# Patient Record
Sex: Female | Born: 1975 | Race: Black or African American | Hispanic: No | Marital: Single | Smoking: Former smoker
Health system: Southern US, Community
[De-identification: ages and names within clinical notes are randomized; demographics above are authoritative.]

## PROBLEM LIST (undated history)

## (undated) ENCOUNTER — Emergency Department (HOSPITAL_COMMUNITY): Admission: EM | Payer: 59 | Source: Home / Self Care

## (undated) DIAGNOSIS — K649 Unspecified hemorrhoids: Secondary | ICD-10-CM

## (undated) DIAGNOSIS — J189 Pneumonia, unspecified organism: Secondary | ICD-10-CM

## (undated) DIAGNOSIS — I10 Essential (primary) hypertension: Secondary | ICD-10-CM

## (undated) DIAGNOSIS — F32A Depression, unspecified: Secondary | ICD-10-CM

## (undated) DIAGNOSIS — M1990 Unspecified osteoarthritis, unspecified site: Secondary | ICD-10-CM

## (undated) HISTORY — PX: COLONOSCOPY: SHX174

## (undated) HISTORY — DX: Depression, unspecified: F32.A

## (undated) HISTORY — DX: Pneumonia, unspecified organism: J18.9

## (undated) HISTORY — DX: Unspecified hemorrhoids: K64.9

## (undated) HISTORY — DX: Unspecified osteoarthritis, unspecified site: M19.90

---

## 2008-11-19 ENCOUNTER — Emergency Department (HOSPITAL_COMMUNITY): Admission: EM | Admit: 2008-11-19 | Discharge: 2008-11-19 | Payer: Self-pay | Admitting: Emergency Medicine

## 2009-06-19 ENCOUNTER — Emergency Department (HOSPITAL_COMMUNITY): Admission: EM | Admit: 2009-06-19 | Discharge: 2009-06-19 | Payer: Self-pay | Admitting: Emergency Medicine

## 2009-12-16 ENCOUNTER — Emergency Department (HOSPITAL_COMMUNITY): Admission: EM | Admit: 2009-12-16 | Discharge: 2009-12-16 | Payer: Self-pay | Admitting: Emergency Medicine

## 2010-12-04 LAB — COMPREHENSIVE METABOLIC PANEL
ALT: 15 U/L (ref 0–35)
AST: 25 U/L (ref 0–37)
CO2: 22 mEq/L (ref 19–32)
Calcium: 9 mg/dL (ref 8.4–10.5)
Chloride: 106 mEq/L (ref 96–112)
GFR calc Af Amer: >60 mL/min (ref >60)
GFR calc non Af Amer: >60 mL/min (ref >60)
Potassium: 3.8 mEq/L (ref 3.5–5.1)
Sodium: 137 mEq/L (ref 135–145)

## 2010-12-04 LAB — CBC
MCHC: 33.5 g/dL (ref 30.0–36.0)
RBC: 4.7 MIL/uL (ref 3.87–5.11)
WBC: 9 10*3/uL (ref 4.0–10.5)

## 2010-12-04 LAB — LIPASE, BLOOD: Lipase: 21 U/L (ref 11–59)

## 2010-12-04 LAB — URINALYSIS, ROUTINE W REFLEX MICROSCOPIC
Bilirubin Urine: NEGATIVE
Glucose, UA: NEGATIVE mg/dL
Ketones, ur: NEGATIVE mg/dL
pH: 6 (ref 5.0–8.0)

## 2010-12-04 LAB — DIFFERENTIAL
Eosinophils Absolute: 0 10*3/uL (ref 0.0–0.7)
Eosinophils Relative: 1 % (ref 0–5)
Lymphs Abs: 1 10*3/uL (ref 0.7–4.0)
Monocytes Relative: 8 % (ref 3–12)

## 2010-12-04 LAB — URINE MICROSCOPIC-ADD ON

## 2010-12-04 LAB — POCT PREGNANCY, URINE: Preg Test, Ur: NEGATIVE

## 2010-12-26 LAB — RAPID STREP SCREEN (MED CTR MEBANE ONLY): Streptococcus, Group A Screen (Direct): POSITIVE — AB

## 2011-10-20 ENCOUNTER — Emergency Department (HOSPITAL_COMMUNITY)
Admission: EM | Admit: 2011-10-20 | Discharge: 2011-10-20 | Disposition: A | Discharge status: Home / Self Care | Payer: Medicaid Other | Attending: Emergency Medicine | Admitting: Emergency Medicine

## 2011-10-20 ENCOUNTER — Encounter (HOSPITAL_COMMUNITY): Payer: Self-pay | Admitting: Emergency Medicine

## 2011-10-20 DIAGNOSIS — H9209 Otalgia, unspecified ear: Secondary | ICD-10-CM | POA: Insufficient documentation

## 2011-10-20 DIAGNOSIS — R51 Headache: Secondary | ICD-10-CM | POA: Insufficient documentation

## 2011-10-20 DIAGNOSIS — M542 Cervicalgia: Secondary | ICD-10-CM | POA: Insufficient documentation

## 2011-10-20 DIAGNOSIS — M26609 Unspecified temporomandibular joint disorder, unspecified side: Secondary | ICD-10-CM | POA: Insufficient documentation

## 2011-10-20 DIAGNOSIS — M25519 Pain in unspecified shoulder: Secondary | ICD-10-CM | POA: Insufficient documentation

## 2011-10-20 MED ORDER — ANTIPYRINE-BENZOCAINE 5.4-1.4 % OT SOLN
3.0000 [drp] | Freq: Once | OTIC | Status: AC
  Administered 2011-10-20: 4 [drp] via OTIC
  Filled 2011-10-20: qty 10

## 2011-10-20 MED ORDER — IBUPROFEN 800 MG PO TABS: 800.0000 mg | ORAL_TABLET | Freq: Three times a day (TID) | ORAL | Status: AC | PRN

## 2011-10-20 MED ORDER — HYDROCODONE-ACETAMINOPHEN 5-325 MG PO TABS: 2.0000 | ORAL_TABLET | ORAL | Status: AC | PRN

## 2011-10-20 NOTE — ED Provider Notes (Signed)
History     CSN: [id number]  Arrival date & time 10/20/11  1652   First MD Initiated Contact with Patient 10/20/11 1947      Chief Complaint  Patient presents with  . Otalgia     HPI  History provided by the patient. Patient is a 36 year old female with no significant past medical history who presents with complaints of left ear and face pain that began early this morning. He states that she first noticed having some soreness in her shoulder and neck area but then had sharp pains coming from her ear and face. Pain has persisted. Pain is worse with movements of mouth and jaw. Patient states that she's even been "unable to chew her gum". Patient denies taking any medications for her symptoms. He denies any recent trauma to her ear. She denies any recent swimming. Patient denies having similar symptoms in the past. Patient denies any recent cough, nasal congestion, runny nose, fever, chills. Patient has no other complaints.      History reviewed. No pertinent past medical history.  History reviewed. No pertinent past surgical history.  No family history on file.  History  Substance Use Topics  . Smoking status: Current Everyday Smoker  . Smokeless tobacco: Not on file  . Alcohol Use: Yes     2 or 3 times weekly    OB History    Grav Para Term Preterm Abortions TAB SAB Ect Mult Living                  Review of Systems  Constitutional: Negative for fever and chills.  HENT: Positive for ear pain. Negative for hearing loss, congestion, sore throat, rhinorrhea and neck pain.   Respiratory: Negative for cough and shortness of breath.   All other systems reviewed and are negative.    Allergies  Review of patient's allergies indicates no known allergies.  Home Medications   Current Outpatient Rx  Name Route Sig Dispense Refill  . VITAMIN D (ERGOCALCIFEROL) 50000 UNITS PO CAPS Oral Take 50,000 Units by mouth every 7 (seven) days. Takes on Sundays.      BP 121/87   Pulse 92  Temp(Src) 97.1 F (36.2 C) (Oral)  Resp 15  SpO2 96%  LMP 10/16/2011  Physical Exam  Nursing note and vitals reviewed. Constitutional: She is oriented to person, place, and time. She appears well-developed and well-nourished. No distress.  HENT:  Head: Normocephalic and atraumatic.  Right Ear: External ear normal.  Nose: Nose normal.  Mouth/Throat: Oropharynx is clear and moist.       Erythema along umbo of left TM. TM otherwise appears normal nonbulging. No ear canal. No pain with manipulation of tragus or pinna. Tenderness to palpation over left TMJ area. No popping or clicking with movement. Normal dentition.  Eyes: Conjunctivae and EOM are normal. Pupils are equal, round, and reactive to light.  Neck: Normal range of motion. Neck supple.  Cardiovascular: Normal rate and regular rhythm.   Pulmonary/Chest: Effort normal and breath sounds normal. No respiratory distress. She has no wheezes. She has no rales.  Lymphadenopathy:    She has no cervical adenopathy.  Neurological: She is alert and oriented to person, place, and time.  Skin: Skin is warm and dry. No rash noted.  Psychiatric: She has a normal mood and affect. Her behavior is normal.    ED Course  Procedures   1. Otalgia   2. TMJ (temporomandibular joint disorder)       MDM  7:30PM pt seen and evaluated. Patient in no acute distress.        Angus Seller, [place] 10/20/11 2024

## 2011-10-20 NOTE — ED Notes (Signed)
Pt st's she had pain in left shoulder yesterday now pain in left side of neck with left ear pain

## 2011-10-21 NOTE — ED Provider Notes (Signed)
Medical screening examination/treatment/procedure(s) were conducted as a shared visit with non-physician practitioner(s) and myself.  I personally evaluated the patient during the encounter    Yanni Quiroa R Sharnetta Gielow, MD 10/21/11 1057 

## 2014-06-30 ENCOUNTER — Encounter (HOSPITAL_COMMUNITY): Payer: Self-pay | Admitting: Emergency Medicine

## 2014-06-30 ENCOUNTER — Emergency Department (HOSPITAL_COMMUNITY)
Admission: EM | Admit: 2014-06-30 | Discharge: 2014-06-30 | Disposition: A | Discharge status: Home / Self Care | Payer: Medicaid Other | Attending: Emergency Medicine | Admitting: Emergency Medicine

## 2014-06-30 DIAGNOSIS — R1084 Generalized abdominal pain: Secondary | ICD-10-CM | POA: Diagnosis present

## 2014-06-30 DIAGNOSIS — Z3202 Encounter for pregnancy test, result negative: Secondary | ICD-10-CM | POA: Diagnosis not present

## 2014-06-30 DIAGNOSIS — Z720 Tobacco use: Secondary | ICD-10-CM | POA: Diagnosis not present

## 2014-06-30 DIAGNOSIS — A084 Viral intestinal infection, unspecified: Secondary | ICD-10-CM | POA: Insufficient documentation

## 2014-06-30 LAB — URINALYSIS, ROUTINE W REFLEX MICROSCOPIC
Glucose, UA: NEGATIVE mg/dL
HGB URINE DIPSTICK: NEGATIVE
Ketones, ur: 15 mg/dL — AB
NITRITE: NEGATIVE
PH: 5 (ref 5.0–8.0)
Protein, ur: NEGATIVE mg/dL
SPECIFIC GRAVITY, URINE: 1.024 (ref 1.005–1.030)
UROBILINOGEN UA: 0.2 mg/dL (ref 0.0–1.0)

## 2014-06-30 LAB — CBC WITH DIFFERENTIAL/PLATELET
BASOS PCT: 0 % (ref 0–1)
Basophils Absolute: 0 10*3/uL (ref 0.0–0.1)
EOS ABS: 0.2 10*3/uL (ref 0.0–0.7)
EOS PCT: 3 % (ref 0–5)
HEMATOCRIT: 45.4 % (ref 36.0–46.0)
HEMOGLOBIN: 15.5 g/dL — AB (ref 12.0–15.0)
LYMPHS ABS: 2 10*3/uL (ref 0.7–4.0)
Lymphocytes Relative: 34 % (ref 12–46)
MCH: 29.1 pg (ref 26.0–34.0)
MCHC: 34.1 g/dL (ref 30.0–36.0)
MCV: 85.2 fL (ref 78.0–100.0)
MONO ABS: 0.6 10*3/uL (ref 0.1–1.0)
MONOS PCT: 9 % (ref 3–12)
NEUTROS PCT: 54 % (ref 43–77)
Neutro Abs: 3.2 10*3/uL (ref 1.7–7.7)
Platelets: 315 10*3/uL (ref 150–400)
RBC: 5.33 MIL/uL — AB (ref 3.87–5.11)
RDW: 14 % (ref 11.5–15.5)
WBC: 6 10*3/uL (ref 4.0–10.5)

## 2014-06-30 LAB — COMPREHENSIVE METABOLIC PANEL
ALBUMIN: 4 g/dL (ref 3.5–5.2)
ALK PHOS: 104 U/L (ref 39–117)
ALT: 9 U/L (ref 0–35)
ANION GAP: 14 (ref 5–15)
AST: 16 U/L (ref 0–37)
BUN: 8 mg/dL (ref 6–23)
CO2: 22 mEq/L (ref 19–32)
CREATININE: 0.81 mg/dL (ref 0.50–1.10)
Calcium: 9.5 mg/dL (ref 8.4–10.5)
Chloride: 103 mEq/L (ref 96–112)
GFR calc non Af Amer: >90 mL/min (ref >90)
GLUCOSE: 103 mg/dL — AB (ref 70–99)
POTASSIUM: 4.3 meq/L (ref 3.7–5.3)
Sodium: 139 mEq/L (ref 137–147)
TOTAL PROTEIN: 8.1 g/dL (ref 6.0–8.3)
Total Bilirubin: 0.7 mg/dL (ref 0.3–1.2)

## 2014-06-30 LAB — POC URINE PREG, ED: Preg Test, Ur: NEGATIVE

## 2014-06-30 LAB — URINE MICROSCOPIC-ADD ON

## 2014-06-30 LAB — LIPASE, BLOOD: LIPASE: 19 U/L (ref 11–59)

## 2014-06-30 MED ORDER — ONDANSETRON HCL 4 MG/2ML IJ SOLN
4.0000 mg | Freq: Once | INTRAMUSCULAR | Status: AC
  Administered 2014-06-30: 4 mg via INTRAVENOUS
  Filled 2014-06-30: qty 2

## 2014-06-30 MED ORDER — ONDANSETRON 4 MG PO TBDP: 4.0000 mg | ORAL_TABLET | Freq: Three times a day (TID) | ORAL | Status: DC | PRN

## 2014-06-30 MED ORDER — MORPHINE SULFATE 4 MG/ML IJ SOLN
4.0000 mg | Freq: Once | INTRAMUSCULAR | Status: DC
  Filled 2014-06-30: qty 1

## 2014-06-30 MED ORDER — SODIUM CHLORIDE 0.9 % IV BOLUS (SEPSIS)
1000.0000 mL | Freq: Once | INTRAVENOUS | Status: AC
  Administered 2014-06-30: 1000 mL via INTRAVENOUS

## 2014-06-30 NOTE — ED Notes (Signed)
Pt c/o N/V/D with abd pain and cramping

## 2014-06-30 NOTE — ED Provider Notes (Signed)
CSN: [id number]     Arrival date & time 06/30/14  1547 History   First MD Initiated Contact with Patient 06/30/14 1832     Chief Complaint  Patient presents with  . Abdominal Pain  . Emesis  . Diarrhea   Patient is a 38 y.o. female presenting with abdominal pain. The history is provided by the patient.  Abdominal Pain Pain location:  Generalized Pain quality: aching   Pain radiates to:  Does not radiate Pain severity:  Moderate Onset quality:  Gradual Duration:  1 day Timing:  Constant Progression:  Unchanged Chronicity:  New Relieved by:  Nothing Worsened by:  Nothing tried Ineffective treatments:  None tried Associated symptoms: diarrhea, nausea and vomiting   Associated symptoms: no chest pain, no constipation, no dysuria, no fever, no hematemesis, no hematochezia, no hematuria, no melena, no shortness of breath, no sore throat, no vaginal bleeding and no vaginal discharge   Risk factors comment:  Sick contacts  38 year old [place] American female with no significant past medical history presents with abdominal pain, nausea vomiting and diarrhea. Patient states symptoms began last night. Patient is a vegetarian and does not usually eat ice cream. However she states she ate a little less than a pint of ice cream yesterday. 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 Patient denies hematochezia or melena. She states that she feels tired. He denies fever, chills, night sweats, recent travel, vaginal bleeding or discharge, dysuria, hematuria.  History reviewed. No pertinent past medical history. History reviewed. No pertinent past surgical history. History reviewed. No pertinent family history. History  Substance Use Topics  . Smoking status: Current Every Day Smoker  . Smokeless tobacco: Not on file  . Alcohol Use: Yes     Comment: 2 or 3 times weekly   OB History   Grav Para Term Preterm Abortions TAB SAB Ect Mult Living                 Review of Systems  Constitutional: Negative  for fever.  HENT: Negative for rhinorrhea and sore throat.   Eyes: Negative for visual disturbance.  Respiratory: Negative for chest tightness and shortness of breath.   Cardiovascular: Negative for chest pain and palpitations.  Gastrointestinal: Positive for nausea, vomiting, abdominal pain and diarrhea. Negative for constipation, blood in stool, melena, hematochezia and hematemesis.  Genitourinary: Negative for dysuria, hematuria, vaginal bleeding and vaginal discharge.  Musculoskeletal: Negative for back pain and neck pain.  Skin: Negative for rash.  Neurological: Negative for dizziness and headaches.  Psychiatric/Behavioral: Negative for confusion.  All other systems reviewed and are negative.  Allergies  Orange fruit  Home Medications   Prior to Admission medications   Medication Sig Start Date End Date Taking? Authorizing Provider  Cal Carb-Mag Hydrox-Simeth (ROLAIDS MULTI-SYMPTOM) [phone] MG CHEW Chew 2 tablets by mouth daily as needed (for heartburn).   Yes Historical Provider, MD  ondansetron (ZOFRAN ODT) 4 MG disintegrating tablet Take 1 tablet (4 mg total) by mouth every 8 (eight) hours as needed for nausea or vomiting. 06/30/14   Gustavus Bryant, MD   BP 114/83  Pulse 128  Temp(Src) 97.9 F (36.6 C) (Oral)  Resp 18  Ht 5' (1.524 m)  Wt 150 lb (68.04 kg)  BMI 29.30 kg/m2  SpO2 100% Physical Exam  Constitutional: She is oriented to person, place, and time. She appears well-developed and well-nourished. No distress.  HENT:  Head: Normocephalic and atraumatic.  Mouth/Throat: Oropharynx is clear and moist.  Eyes: EOM are normal. Pupils are  equal, round, and reactive to light.  Neck: Neck supple. No JVD present.  Cardiovascular: Normal rate, regular rhythm, normal heart sounds and intact distal pulses.  Exam reveals no gallop.   No murmur heard. Pulmonary/Chest: Effort normal and breath sounds normal. She has no wheezes. She has no rales.  Abdominal: Soft. She  exhibits no distension. There is generalized tenderness. There is no rigidity, no rebound, no guarding, no tenderness at McBurney's point and negative Murphy's sign.  Musculoskeletal: Normal range of motion. She exhibits no tenderness.  Neurological: She is alert and oriented to person, place, and time. No cranial nerve deficit. She exhibits normal muscle tone.  Skin: Skin is warm and dry. No rash noted.  Psychiatric: Her behavior is normal.    ED Course  Procedures none  Labs Review  Labs Reviewed  CBC WITH DIFFERENTIAL - Abnormal; Notable for the following:    RBC 5.33 (*)    Hemoglobin 15.5 (*)    All other components within normal limits  COMPREHENSIVE METABOLIC PANEL - Abnormal; Notable for the following:    Glucose, Bld 103 (*)    All other components within normal limits  URINALYSIS, ROUTINE W REFLEX MICROSCOPIC - Abnormal; Notable for the following:    Color, Urine AMBER (*)    Bilirubin Urine MODERATE (*)    Ketones, ur 15 (*)    Leukocytes, UA TRACE (*)    All other components within normal limits  URINE MICROSCOPIC-ADD ON - Abnormal; Notable for the following:    Squamous Epithelial / LPF MANY (*)    Bacteria, UA FEW (*)    All other components within normal limits  URINE CULTURE  LIPASE, BLOOD  POC URINE PREG, ED   MDM   Final diagnoses:  Viral gastroenteritis   Otherwise healthy 38 year old female presenting with nausea, vomiting, diarrhea since last night. She is well appearing on exam. Abdomen is non-peritonitic. Lungs are clear to auscultation. Suspect viral gastroenteritis. She is tachycardic otherwise stable. Normal saline bolus, Zofran, morphine given for symptoms. Patient desires to be discharged home. Fill this is reasonable. Do not feel further workup is indicated at this time. Patient given strict return precautions. Patient will be discharged with Zofran for continued symptoms. Patient should do to followup with her PCP as needed. Patient stable for  discharge at this time.  Case discussed with Dr. Johnney Killian.   Gustavus Bryant, MD 06/30/14 [phone]

## 2014-06-30 NOTE — ED Provider Notes (Signed)
I saw and evaluated the patient, reviewed the resident's note and I agree with the findings and plan.   EKG Interpretation None     I see and evaluate the patient and agree with the current plan. The patient reports 2 episodes of vomiting and multiple episodes of diarrhea overnight she does endorse some diffuse abdominal pain but no localizing.  I have examined the agent she is alert and nontoxic in appearance. She has no respiratory distress. Her abdomen is soft with mild lower diffuse discomfort to palpation. Her skin is warm and dry and her mental status is normal.  Findings are consistent with gastroenteritis and the patient will be treated symptomatically.  Charlesetta Shanks, MD 06/30/14 2038

## 2014-07-02 LAB — URINE CULTURE: Colony Count: 5000

## 2014-09-15 HISTORY — PX: WISDOM TOOTH EXTRACTION: SHX21

## 2015-05-02 ENCOUNTER — Emergency Department (INDEPENDENT_AMBULATORY_CARE_PROVIDER_SITE_OTHER)
Admission: EM | Admit: 2015-05-02 | Discharge: 2015-05-02 | Disposition: A | Discharge status: Home / Self Care | Payer: Self-pay | Source: Home / Self Care | Attending: Family Medicine | Admitting: Family Medicine

## 2015-05-02 ENCOUNTER — Encounter (HOSPITAL_COMMUNITY): Payer: Self-pay | Admitting: Emergency Medicine

## 2015-05-02 DIAGNOSIS — H66001 Acute suppurative otitis media without spontaneous rupture of ear drum, right ear: Secondary | ICD-10-CM

## 2015-05-02 DIAGNOSIS — J0140 Acute pansinusitis, unspecified: Secondary | ICD-10-CM

## 2015-05-02 MED ORDER — AMOXICILLIN-POT CLAVULANATE 875-125 MG PO TABS: 1.0000 | ORAL_TABLET | Freq: Two times a day (BID) | ORAL | Status: DC

## 2015-05-02 MED ORDER — GUAIFENESIN-CODEINE 100-10 MG/5ML PO SOLN: 5.0000 mL | Freq: Four times a day (QID) | ORAL | Status: DC | PRN

## 2015-05-02 MED ORDER — IPRATROPIUM BROMIDE 0.06 % NA SOLN: 2.0000 | Freq: Four times a day (QID) | NASAL | Status: DC | PRN

## 2015-05-02 NOTE — ED Provider Notes (Signed)
CSN: [id number]     Arrival date & time 05/02/15  1325 History   First MD Initiated Contact with Patient 05/02/15 1419     Chief Complaint  Patient presents with  . URI   (Consider location/radiation/quality/duration/timing/severity/associated sxs/prior Treatment) HPI  Cough. Started 2 weeks ago. Has not got better or worse. Occasionally productive. Initially had a little bit of chest pain with coughing only but that is gone. No shortness of breath, occasional chills. Denies any nausea vomiting, neck stiffness. Intermittent headaches. Patient tried over-the-counter cold and flu medicines without improvement. Now she has facial pain and right ear pain. Pain is constant and getting worse. It is nonradiating.  History reviewed. No pertinent past medical history. History reviewed. No pertinent past surgical history. Family History  Problem Relation Age of Onset  . Diabetes Other   . Hypertension Other    Social History  Substance Use Topics  . Smoking status: Current Every Day Smoker  . Smokeless tobacco: None  . Alcohol Use: Yes     Comment: 2 or 3 times weekly   OB History    No data available     Review of Systems Per HPI with all other pertinent systems negative.   Allergies  Orange fruit  Home Medications   Prior to Admission medications   Medication Sig Start Date End Date Taking? Authorizing Provider  amoxicillin-clavulanate (AUGMENTIN) 875-125 MG per tablet Take 1 tablet by mouth 2 (two) times daily. 05/02/15   Waldemar Dickens, MD  Cal Carb-Mag Hydrox-Simeth (ROLAIDS MULTI-SYMPTOM) [phone] MG CHEW Chew 2 tablets by mouth daily as needed (for heartburn).    Historical Provider, MD  guaiFENesin-codeine 100-10 MG/5ML syrup Take 5-10 mLs by mouth every 6 (six) hours as needed for cough. 05/02/15   Waldemar Dickens, MD  ipratropium (ATROVENT) 0.06 % nasal spray Place 2 sprays into both nostrils 4 (four) times daily as needed for rhinitis. 05/02/15   Waldemar Dickens, MD   ondansetron (ZOFRAN ODT) 4 MG disintegrating tablet Take 1 tablet (4 mg total) by mouth every 8 (eight) hours as needed for nausea or vomiting. 06/30/14   Gustavus Bryant, MD   BP 131/86 mmHg  Pulse 102  Temp(Src) 99.4 F (37.4 C) (Oral)  Resp 16  SpO2 99%  LMP 04/15/2015 Physical Exam Physical Exam  Constitutional: oriented to person, place, and time. appears well-developed and well-nourished. No distress.  HENT:  Head: Normocephalic and atraumatic.  Right TM with purulent effusion and bulging. Left TM with clear effusion. Frontal maxillary sinuses tender to palpation. Eyes: EOMI. PERRL.  Neck: Normal range of motion.  Cardiovascular: RRR, no m/r/g, 2+ distal pulses,  Pulmonary/Chest: Effort normal and breath sounds normal. No respiratory distress.  Abdominal: Soft. Bowel sounds are normal. NonTTP, no distension.  Musculoskeletal: Normal range of motion. Non ttp, no effusion.  Neurological: alert and oriented to person, place, and time.  Skin: Skin is warm. No rash noted. non diaphoretic.  Psychiatric: normal mood and affect. behavior is normal. Judgment and thought content normal.   ED Course  Procedures (including critical care time) Labs Review Labs Reviewed - No data to display  Imaging Review No results found.   MDM   1. Acute pansinusitis, recurrence not specified   2. Acute suppurative otitis media of right ear without spontaneous rupture of tympanic membrane, recurrence not specified    Augmentin, Motrin, Robitussin-AC, nasal Atrovent.    Waldemar Dickens, MD 05/02/15 [phone]

## 2015-05-02 NOTE — ED Notes (Signed)
C/o cold sx for three weeks States she has a cough, runny nose, congested, and headache Tylenol and robtussin used as tx

## 2015-05-02 NOTE — Discharge Instructions (Signed)
You have a sinus infection  Please start the antibiotic Please [place] a probiotic or activia yogurt to help with any diarrhea Ibuprofen 800 every 8 hours Use the Robitussin AC to help with yoru cough and to sleep at night The atrovent to help with runny nose and congestion A good natural nose cleanser is nasal saline.

## 2016-09-15 HISTORY — PX: OTHER SURGICAL HISTORY: SHX169

## 2016-12-05 ENCOUNTER — Other Ambulatory Visit: Payer: Self-pay | Admitting: Family Medicine

## 2016-12-05 DIAGNOSIS — Z1231 Encounter for screening mammogram for malignant neoplasm of breast: Secondary | ICD-10-CM

## 2016-12-22 ENCOUNTER — Ambulatory Visit
Admission: RE | Admit: 2016-12-22 | Discharge: 2016-12-22 | Disposition: A | Discharge status: Home / Self Care | Payer: 59 | Source: Ambulatory Visit | Attending: Family Medicine | Admitting: Family Medicine

## 2016-12-22 DIAGNOSIS — Z1231 Encounter for screening mammogram for malignant neoplasm of breast: Secondary | ICD-10-CM

## 2017-05-06 ENCOUNTER — Emergency Department (HOSPITAL_COMMUNITY)
Admission: EM | Admit: 2017-05-06 | Discharge: 2017-05-06 | Disposition: A | Discharge status: Home / Self Care | Payer: 59 | Attending: Emergency Medicine | Admitting: Emergency Medicine

## 2017-05-06 ENCOUNTER — Encounter (HOSPITAL_COMMUNITY): Payer: Self-pay | Admitting: Nurse Practitioner

## 2017-05-06 ENCOUNTER — Encounter (HOSPITAL_COMMUNITY): Payer: Self-pay | Admitting: Neurology

## 2017-05-06 ENCOUNTER — Emergency Department (HOSPITAL_COMMUNITY): Payer: 59

## 2017-05-06 ENCOUNTER — Ambulatory Visit (HOSPITAL_COMMUNITY)
Admission: EM | Admit: 2017-05-06 | Discharge: 2017-05-06 | Disposition: A | Discharge status: Home / Self Care | Payer: 59 | Attending: Family Medicine | Admitting: Family Medicine

## 2017-05-06 DIAGNOSIS — M25512 Pain in left shoulder: Secondary | ICD-10-CM | POA: Diagnosis present

## 2017-05-06 DIAGNOSIS — R4789 Other speech disturbances: Secondary | ICD-10-CM | POA: Diagnosis not present

## 2017-05-06 DIAGNOSIS — F449 Dissociative and conversion disorder, unspecified: Secondary | ICD-10-CM | POA: Diagnosis not present

## 2017-05-06 DIAGNOSIS — R4781 Slurred speech: Secondary | ICD-10-CM

## 2017-05-06 DIAGNOSIS — I10 Essential (primary) hypertension: Secondary | ICD-10-CM | POA: Diagnosis not present

## 2017-05-06 DIAGNOSIS — R531 Weakness: Secondary | ICD-10-CM

## 2017-05-06 DIAGNOSIS — R490 Dysphonia: Secondary | ICD-10-CM

## 2017-05-06 DIAGNOSIS — Z87891 Personal history of nicotine dependence: Secondary | ICD-10-CM | POA: Insufficient documentation

## 2017-05-06 DIAGNOSIS — M542 Cervicalgia: Secondary | ICD-10-CM | POA: Diagnosis not present

## 2017-05-06 HISTORY — DX: Essential (primary) hypertension: I10

## 2017-05-06 LAB — PROTIME-INR
INR: 1.09
Prothrombin Time: 14.2 seconds (ref 11.4–15.2)

## 2017-05-06 LAB — CBC
HEMATOCRIT: 37.9 % (ref 36.0–46.0)
HEMOGLOBIN: 12.5 g/dL (ref 12.0–15.0)
MCH: 28.4 pg (ref 26.0–34.0)
MCHC: 33 g/dL (ref 30.0–36.0)
MCV: 86.1 fL (ref 78.0–100.0)
Platelets: 301 10*3/uL (ref 150–400)
RBC: 4.4 MIL/uL (ref 3.87–5.11)
RDW: 14.1 % (ref 11.5–15.5)
WBC: 4.4 10*3/uL (ref 4.0–10.5)

## 2017-05-06 LAB — CBG MONITORING, ED: Glucose-Capillary: 98 mg/dL (ref 65–99)

## 2017-05-06 LAB — DIFFERENTIAL
BASOS ABS: 0 10*3/uL (ref 0.0–0.1)
Basophils Relative: 1 %
EOS ABS: 0.1 10*3/uL (ref 0.0–0.7)
Eosinophils Relative: 3 %
LYMPHS ABS: 2 10*3/uL (ref 0.7–4.0)
LYMPHS PCT: 44 %
MONOS PCT: 7 %
Monocytes Absolute: 0.3 10*3/uL (ref 0.1–1.0)
NEUTROS PCT: 45 %
Neutro Abs: 2 10*3/uL (ref 1.7–7.7)

## 2017-05-06 LAB — I-STAT CHEM 8, ED
BUN: 10 mg/dL (ref 6–20)
CREATININE: 0.7 mg/dL (ref 0.44–1.00)
Calcium, Ion: 1.07 mmol/L — ABNORMAL LOW (ref 1.15–1.40)
Chloride: 104 mmol/L (ref 101–111)
GLUCOSE: 97 mg/dL (ref 65–99)
HCT: 40 % (ref 36.0–46.0)
HEMOGLOBIN: 13.6 g/dL (ref 12.0–15.0)
Potassium: 3.4 mmol/L — ABNORMAL LOW (ref 3.5–5.1)
Sodium: 141 mmol/L (ref 135–145)
TCO2: 27 mmol/L (ref 0–100)

## 2017-05-06 LAB — COMPREHENSIVE METABOLIC PANEL
ALBUMIN: 3.8 g/dL (ref 3.5–5.0)
ALK PHOS: 64 U/L (ref 38–126)
ALT: 16 U/L (ref 14–54)
AST: 25 U/L (ref 15–41)
Anion gap: 8 (ref 5–15)
BILIRUBIN TOTAL: 0.4 mg/dL (ref 0.3–1.2)
BUN: 9 mg/dL (ref 6–20)
CALCIUM: 8.6 mg/dL — AB (ref 8.9–10.3)
CO2: 23 mmol/L (ref 22–32)
CREATININE: 0.73 mg/dL (ref 0.44–1.00)
Chloride: 109 mmol/L (ref 101–111)
GFR calc Af Amer: >60 mL/min (ref >60)
GLUCOSE: 95 mg/dL (ref 65–99)
POTASSIUM: 3.1 mmol/L — AB (ref 3.5–5.1)
Sodium: 140 mmol/L (ref 135–145)
TOTAL PROTEIN: 6.4 g/dL — AB (ref 6.5–8.1)

## 2017-05-06 LAB — I-STAT TROPONIN, ED: TROPONIN I, POC: 0 ng/mL (ref 0.00–0.08)

## 2017-05-06 LAB — ETHANOL: Alcohol, Ethyl (B): <5 mg/dL (ref <5)

## 2017-05-06 LAB — GLUCOSE, CAPILLARY: GLUCOSE-CAPILLARY: 99 mg/dL (ref 65–99)

## 2017-05-06 LAB — APTT: APTT: 30 s (ref 24–36)

## 2017-05-06 MED ORDER — POTASSIUM CHLORIDE CRYS ER 20 MEQ PO TBCR: 20.0000 meq | EXTENDED_RELEASE_TABLET | Freq: Once | ORAL | Status: DC

## 2017-05-06 NOTE — ED Notes (Signed)
Called xray to check on patient. Reports that she is in MRI.

## 2017-05-06 NOTE — Discharge Instructions (Signed)
Your workup today did not show evidence of acute stroke. We are unsure of the cause of your difficulty speaking and left shoulder pain. Your images did not show evidence of fracture or dislocation. Please follow-up with your primary care physician for further management. If any symptoms change or worsen, please return to the nearest emergency department.

## 2017-05-06 NOTE — Consult Note (Signed)
Neurology Consultation  Reason for Consult: rule out stroke Referring Physician: ER  CC: aphonia and weakness  History is obtained from:Patient and [facility]link  HPI: Alexandra Reed is a 41 y.o. female woman with no significant past medical history who comes from urgent care.  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 She then walked to the urgent care center and was sent here to rule out CVA. Patient does have history of hypertension and took her meds this morning. Blood pressure in the [facility]link van was 180/105. CBG was 91. Code stroke was called, and pt was taken to CT scan. On exam, pt is able to mouth the words but has some loss of voice.   LKW: 11.30 AM Date 8.22.2018 tpa given?: no, minimal symptoms and negative MRI NIHSS 0 MRs 0  ROS: A 14 point ROS was performed and is negative except as noted in the HPI.   Past Medical History:  Diagnosis Date  . Hypertension      Family History  Problem Relation Age of Onset  . Diabetes Other   . Hypertension Other      Social History:  reports that she has quit smoking. She has never used smokeless tobacco. She reports that she drinks alcohol. She reports that she does not use drugs.   Exam: Current vital signs: BP (!) 165/101 (BP Location: Left Arm)   Pulse (!) 55   Temp 97.7 F (36.5 C) (Oral)   Resp 15   Wt 155 lb 10.3 oz (70.6 kg)   LMP 04/23/2017   SpO2 100%   BMI 30.40 kg/m  Vital signs in last 24 hours: Temp:  [97.7 F (36.5 C)-98.4 F (36.9 C)] 97.7 F (36.5 C) (08/22 1415) Pulse Rate:  [55-86] 55 (08/22 1415) Resp:  [15-16] 15 (08/22 1415) BP: (156-165)/(95-101) 165/101 (08/22 1415) SpO2:  [100 %] 100 % (08/22 1415) Weight:  [155 lb 10.3 oz (70.6 kg)] 155 lb 10.3 oz (70.6 kg) (08/22 1412)   Physical Exam  Constitutional: Appears well-developed and well-nourished.  Psych: Affect appropriate to  situation Eyes: No scleral injection HENT: No OP obstrucion Head: Normocephalic.  Cardiovascular: Normal rate and regular rhythm.  Respiratory: Effort normal and breath sounds normal to anterior ascultation GI: Soft.  No distension. There is no tenderness.  Skin: WDI  Neuro: Mental Status: Patient is awake, alert, oriented to person, place, month, year, and situation. Patient has some aphonia and able to mouth words intermittently. Dysarthria can not be tested.   Cranial Nerves: II: Visual Fields are full. Pupils are equal, round, and reactive to light.   III,IV, VI: EOMI without ptosis or diploplia. V: Facial sensation is symmetric to temperature VII: Facial movement is symmetric.  VIII: hearing is intact to voice X: Uvula elevates symmetrically XI: Shoulder shrug is symmetric. XII: tongue is midline without atrophy or fasciculations.  Motor: Tone is normal. Bulk is normal. 5/5 strength was present in all four extremities. Pt gives in the leg to weakness Sensory: Sensation is symmetric to light touch and temperature in the arms and legs. Cerebellar: FNF and HKS are intact bilaterally      I have reviewed labs in epic and the results pertinent to this consultation are: Results for orders placed or performed during the hospital encounter of 05/06/17 (from the past 24 hour(s))  CBG monitoring, ED     Status: None   Collection  Time: 05/06/17  1:56 PM  Result Value Ref Range   Glucose-Capillary 98 65 - 99 mg/dL  Ethanol     Status: None   Collection Time: 05/06/17  1:56 PM  Result Value Ref Range   Alcohol, Ethyl (B) <5 <5 mg/dL  Protime-INR     Status: None   Collection Time: 05/06/17  1:56 PM  Result Value Ref Range   Prothrombin Time 14.2 11.4 - 15.2 seconds   INR 1.09   APTT     Status: None   Collection Time: 05/06/17  1:56 PM  Result Value Ref Range   aPTT 30 24 - 36 seconds  CBC     Status: None   Collection Time: 05/06/17  1:56 PM  Result Value Ref Range    WBC 4.4 4.0 - 10.5 K/uL   RBC 4.40 3.87 - 5.11 MIL/uL   Hemoglobin 12.5 12.0 - 15.0 g/dL   HCT 37.9 36.0 - 46.0 %   MCV 86.1 78.0 - 100.0 fL   MCH 28.4 26.0 - 34.0 pg   MCHC 33.0 30.0 - 36.0 g/dL   RDW 14.1 11.5 - 15.5 %   Platelets 301 150 - 400 K/uL  Differential     Status: None   Collection Time: 05/06/17  1:56 PM  Result Value Ref Range   Neutrophils Relative % 45 %   Neutro Abs 2.0 1.7 - 7.7 K/uL   Lymphocytes Relative 44 %   Lymphs Abs 2.0 0.7 - 4.0 K/uL   Monocytes Relative 7 %   Monocytes Absolute 0.3 0.1 - 1.0 K/uL   Eosinophils Relative 3 %   Eosinophils Absolute 0.1 0.0 - 0.7 K/uL   Basophils Relative 1 %   Basophils Absolute 0.0 0.0 - 0.1 K/uL  Comprehensive metabolic panel     Status: Abnormal   Collection Time: 05/06/17  1:56 PM  Result Value Ref Range   Sodium 140 135 - 145 mmol/L   Potassium 3.1 (L) 3.5 - 5.1 mmol/L   Chloride 109 101 - 111 mmol/L   CO2 23 22 - 32 mmol/L   Glucose, Bld 95 65 - 99 mg/dL   BUN 9 6 - 20 mg/dL   Creatinine, Ser 0.73 0.44 - 1.00 mg/dL   Calcium 8.6 (L) 8.9 - 10.3 mg/dL   Total Protein 6.4 (L) 6.5 - 8.1 g/dL   Albumin 3.8 3.5 - 5.0 g/dL   AST 25 15 - 41 U/L   ALT 16 14 - 54 U/L   Alkaline Phosphatase 64 38 - 126 U/L   Total Bilirubin 0.4 0.3 - 1.2 mg/dL   GFR calc non Af Amer >60 >60 mL/min   GFR calc Af Amer >60 >60 mL/min   Anion gap 8 5 - 15  I-stat troponin, ED (not at [facility], [facility])     Status: None   Collection Time: 05/06/17  2:02 PM  Result Value Ref Range   Troponin i, poc 0.00 0.00 - 0.08 ng/mL   Comment 3          I-Stat Chem 8, ED  (not at [facility], [facility])     Status: Abnormal   Collection Time: 05/06/17  2:04 PM  Result Value Ref Range   Sodium 141 135 - 145 mmol/L   Potassium 3.4 (L) 3.5 - 5.1 mmol/L   Chloride 104 101 - 111 mmol/L   BUN 10 6 - 20 mg/dL   Creatinine, Ser 0.70 0.44 - 1.00 mg/dL   Glucose, Bld 97 65 - 99  mg/dL   Calcium, Ion 1.07 (L) 1.15 - 1.40 mmol/L   TCO2 27 0 - 100 mmol/L   Hemoglobin 13.6  12.0 - 15.0 g/dL   HCT 40.0 36.0 - 46.0 %     I have reviewed the images obtained:  CT Head code stroke: Normal noncontrast CT and ASPECTS score of 10  Impression:  41 yo with no significant past medical history presents from urgent care with aphonia and some left shoulder pain to evaluate for stroke.  CT head was negative for acute intracranial abnormality. Due to her symptoms and reassuring neurological exam, there is low suspicion for stroke. NIHSS score of 0.  Recommendations: 1) MRI Brain stat 2) If MRI is negative, then no further work up is required, and pt can be discharged.  Signed  Burgess Estelle, MD IM Resident Neurology Team   ATTENDING ADDENDUM LKW: 11.30 AM on 8.22.2018 tpa given?: no, NIHSS 0, functional exam NIHSS 0 mRS at baseline: 0  Agree with H&P as above. Pt seen and examined as a code stroke was called. I requested cancellation of code stroke after my exam. Patient came in with symptoms of speech problems-she was mainly aphonic on exam, mostly functional exam. Low NIH, hence no tPA. No cortical signs, not endovascular candidate. MRI of the brain was unremarkable and impressive for stroke. At this time, I would not recommend any further neurological workup.  Amie Portland, MD [facility] [phone]  If 7pm to 7am, please call on call as listed on AMION.

## 2017-05-06 NOTE — ED Triage Notes (Signed)
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 Code stroke activated. No deficits seen by [facility], is alert, voice is very low, quiet.

## 2017-05-06 NOTE — ED Provider Notes (Signed)
  [place]   [id number] 05/06/17 Arrival Time: 24  ASSESSMENT & PLAN:  No diagnosis found.  No orders of the defined types were placed in this encounter.   Reviewed expectations re: course of current medical issues. Questions answered. Outlined signs and symptoms indicating need for more acute intervention. Patient verbalized understanding. After Visit Summary given.  Car Link Called patient is code stroke IV Oxygen 2 liters [place] SUBJECTIVE:  Alexandra Reed is a 41 y.o. female who presents with complaint of weakness difficulty speaking left neck to left arm weakness.  She had sx's present at 1130 and presented to our check in and was brought immediately back to room 10  ROS: As per HPI.   OBJECTIVE:  Vitals:   05/06/17 1337  BP: (!) 156/95  Pulse: 86  Resp: 16  Temp: 98.4 F (36.9 C)  TempSrc: Oral  SpO2: 100%     General appearance: alert and wheelchair bound with difficulty speaking Eyes: PERRLA; EOMI; conjunctiva normal HENT: normocephalic; atraumatic; TMs normal; nasal mucosa normal; oral mucosa normal Neck: supple Lungs: clear to auscultation bilaterally Heart: regular rate and rhythm Abdomen: soft, non-tender; bowel sounds normal; no masses or organomegaly; no guarding or rebound tenderness Back: no CVA tenderness Extremities: no cyanosis or edema; symmetrical with no gross deformities Skin: warm and dry Neurologic: Alert and Ox3 expressive aphasia, questionable right sided neglect, strength symmetrical bilateral 3/5  Psychological: alert and cooperative; normal mood and affect  History reviewed. No pertinent past medical history.   has no past medical history on file.  Results for orders placed or performed during the hospital encounter of 05/06/17  Glucose, capillary  Result Value Ref Range   Glucose-Capillary 99 65 - 99 mg/dL    Labs Reviewed  GLUCOSE, CAPILLARY    Imaging: No results found.  Allergies  Allergen Reactions  .  Orange Fruit [Citrus] Hives, Itching and Rash    Family History  Problem Relation Age of Onset  . Diabetes Other   . Hypertension Other    History reviewed. No pertinent surgical history.       Lysbeth Penner,  05/06/17 1358

## 2017-05-06 NOTE — Code Documentation (Signed)
41yo female arriving to [facility] via [place] at 86.  Patient from Urgent Care where she presented with initial c/o left neck pain, left sided weakness and slurred speech.  Patient developed symptoms while at work and drove herself to the urgent care.  LKW 1130.  Patient transferred to [facility] from the urgent care.  Stroke team at the bedside on patient arrival.  Labs drawn and patient cleared for CT by Dr. Sherry Ruffing.  Patient to CT with team.  CT completed.  NIHSS 0, see documentation for details and code stroke times.  Patient speaks softly but able to read and name NIHSS cards and answer questions appropriately on exam.  Code stroke canceled.  Bedside handoff with ED RN Judson Roch.

## 2017-05-06 NOTE — ED Provider Notes (Signed)
[facility] DEPT Provider Note   CSN: [id number] Arrival date & time: 05/06/17  1353   An emergency department physician performed an initial assessment on this suspected stroke patient at 1354 (Dontreal Miera).  History   Chief Complaint Chief Complaint  Patient presents with  . Code Stroke    HPI Nanci Allbright is a 41 y.o. female.  The history is provided by the patient and the EMS personnel.  Neurologic Problem  This is a new problem. The current episode started 1 to 2 hours ago. The problem occurs constantly. The problem has been rapidly improving. Pertinent negatives include no chest pain, no abdominal pain, no headaches and no shortness of breath. Nothing aggravates the symptoms. Nothing relieves the symptoms. She has tried nothing for the symptoms. The treatment provided no relief.    Past Medical History:  Diagnosis Date  . Hypertension     There are no active problems to display for this patient.   History reviewed. No pertinent surgical history.  OB History    No data available       Home Medications    Prior to Admission medications   Medication Sig Start Date End Date Taking? Authorizing Provider  amoxicillin-clavulanate (AUGMENTIN) 875-125 MG per tablet Take 1 tablet by mouth 2 (two) times daily. 05/02/15   Waldemar Dickens, MD  Cal Carb-Mag Hydrox-Simeth (ROLAIDS MULTI-SYMPTOM) [phone] MG CHEW Chew 2 tablets by mouth daily as needed (for heartburn).    [provider]  guaiFENesin-codeine 100-10 MG/5ML syrup Take 5-10 mLs by mouth every 6 (six) hours as needed for cough. 05/02/15   Waldemar Dickens, MD  ipratropium (ATROVENT) 0.06 % nasal spray Place 2 sprays into both nostrils 4 (four) times daily as needed for rhinitis. 05/02/15   Waldemar Dickens, MD  ondansetron (ZOFRAN ODT) 4 MG disintegrating tablet Take 1 tablet (4 mg total) by mouth every 8 (eight) hours as needed for nausea or vomiting. 06/30/14   Gustavus Bryant, MD    Family  History Family History  Problem Relation Age of Onset  . Diabetes Other   . Hypertension Other     Social History Social History  Substance Use Topics  . Smoking status: Former Research scientist (life sciences)  . Smokeless tobacco: Never Used  . Alcohol use Yes     Comment: occasional     Allergies   Orange fruit [citrus]   Review of Systems Review of Systems  Constitutional: Negative for appetite change, chills, diaphoresis, fatigue and fever.  HENT: Negative for congestion and rhinorrhea.   Eyes: Negative for photophobia and visual disturbance.  Respiratory: Negative for cough, chest tightness, shortness of breath, wheezing and stridor.   Cardiovascular: Negative for chest pain and palpitations.  Gastrointestinal: Negative for abdominal pain, diarrhea, nausea and vomiting.  Genitourinary: Negative for dysuria and flank pain.  Musculoskeletal: Negative for back pain, neck pain and neck stiffness.  Skin: Negative for rash and wound.  Neurological: Positive for speech difficulty. Negative for syncope, facial asymmetry, weakness, light-headedness, numbness and headaches.  Psychiatric/Behavioral: Negative for agitation.  All other systems reviewed and are negative.    Physical Exam Updated Vital Signs BP (!) 165/101 (BP Location: Left Arm)   Pulse (!) 55   Temp 97.7 F (36.5 C) (Oral)   Resp 15   Wt 70.6 kg (155 lb 10.3 oz)   LMP 04/23/2017   SpO2 100%   BMI 30.40 kg/m   Physical Exam  Constitutional: She is oriented to person, place, and time. She appears well-developed and  well-nourished. No distress.  HENT:  Head: Normocephalic.  Mouth/Throat: Oropharynx is clear and moist. No oropharyngeal exudate.  Eyes: Pupils are equal, round, and reactive to light. Conjunctivae and EOM are normal. No scleral icterus.  Neck: Normal range of motion. No thyromegaly present.  Cardiovascular: Normal rate and intact distal pulses.   No murmur heard. Pulmonary/Chest: Effort normal. No stridor. No  respiratory distress. She has no wheezes. She has no rales. She exhibits no tenderness.  Abdominal: Soft. She exhibits no distension. There is no tenderness.  Musculoskeletal:       Left shoulder: She exhibits tenderness and pain. She exhibits normal range of motion, no deformity, no laceration, no spasm, normal pulse and normal strength.       Arms: Neurological: She is alert and oriented to person, place, and time. She is not disoriented. She displays no tremor and normal reflexes. No cranial nerve deficit or sensory deficit. She exhibits normal muscle tone. She displays no seizure activity. Coordination normal. GCS eye subscore is 4. GCS verbal subscore is 5. GCS motor subscore is 6.  PT speaking in quiet tones. No slurred speech. No focal neurologic deficits.   Skin: Skin is warm. Capillary refill takes less than 2 seconds. No rash noted. She is not diaphoretic. No erythema.  Psychiatric: She has a normal mood and affect.  Nursing note and vitals reviewed.    ED Treatments / Results  Labs (all labs ordered are listed, but only abnormal results are displayed) Labs Reviewed  COMPREHENSIVE METABOLIC PANEL - Abnormal; Notable for the following:       Result Value   Potassium 3.1 (*)    Calcium 8.6 (*)    Total Protein 6.4 (*)    All other components within normal limits  I-STAT CHEM 8, ED - Abnormal; Notable for the following:    Potassium 3.4 (*)    Calcium, Ion 1.07 (*)    All other components within normal limits  ETHANOL  PROTIME-INR  APTT  CBC  DIFFERENTIAL  CBG MONITORING, ED  I-STAT TROPONIN, ED    EKG  EKG Interpretation  Date/Time:  Wednesday May 06 2017 14:14:19 EDT Ventricular Rate:  63 PR Interval:    QRS Duration: 102 QT Interval:  446 QTC Calculation: 457 R Axis:   45 Text Interpretation:  Sinus rhythm No prior ECG for comparison.  No STEMI Confirmed by Antony Blackbird [phone]) on 05/06/2017 3:31:28 PM       Radiology Dg Chest 2 View  Result Date:  05/06/2017 CLINICAL DATA:  Stroke symptoms.  Neck and shoulder pain.  Weakness. EXAM: CHEST  2 VIEW COMPARISON:  No prior . FINDINGS: Mediastinum hilar structures normal. Lungs are clear. No focal infiltrate. No pleural effusion or pneumothorax. Degenerative changes thoracic spine . IMPRESSION: No acute cardiopulmonary disease. Electronically Signed   By: Marcello Moores  Register   On: 05/06/2017 14:57   Mr Brain Wo Contrast  Result Date: 05/06/2017 CLINICAL DATA:  Weakness. Speech disturbance History of hypertension. EXAM: MRI HEAD WITHOUT CONTRAST TECHNIQUE: Multiplanar, multiecho pulse sequences of the brain and surrounding structures were obtained without intravenous contrast. COMPARISON:  CT same day FINDINGS: Brain: Diffusion imaging does not show any acute or subacute infarction. The brainstem and cerebellum are normal. Cerebral hemispheres are normal. No evidence of old small or large vessel insult. No mass lesion, hemorrhage, hydrocephalus or extra-axial collection. Vascular: Major vessels at the base of the brain show flow. Skull and upper cervical spine: Negative Sinuses/Orbits: Clear/ normal Other: None IMPRESSION: Normal  examination. Electronically Signed   By: Nelson Chimes M.D.   On: 05/06/2017 15:38   Dg Shoulder Left  Result Date: 05/06/2017 CLINICAL DATA:  Stroke symptoms.  Neck and shoulder pain . EXAM: LEFT SHOULDER - 2+ VIEW COMPARISON:  No prior . FINDINGS: No acute bony or joint abnormality identified. No evidence of fracture or dislocation. IMPRESSION: No acute abnormality. Electronically Signed   By: Marcello Moores  Register   On: 05/06/2017 14:58   Ct Head Code Stroke Wo Contrast  Result Date: 05/06/2017 CLINICAL DATA:  Code stroke. 41 year old female with left side weakness and abnormal speech. EXAM: CT HEAD WITHOUT CONTRAST TECHNIQUE: Contiguous axial images were obtained from the base of the skull through the vertex without intravenous contrast. COMPARISON:  None. FINDINGS: Brain: Cerebral  volume is normal. No midline shift, ventriculomegaly, mass effect, evidence of mass lesion, intracranial hemorrhage or evidence of cortically based acute infarction. Gray-white matter differentiation is within normal limits throughout the brain. Vascular: Mild Calcified atherosclerosis at the skull base. No suspicious intracranial vascular hyperdensity. Skull: Negative. Sinuses/Orbits: Clear. Other: Visualized orbits and scalp soft tissues are within normal limits. ASPECTS [facility] Stroke Program Early CT Score) - Ganglionic level infarction (caudate, lentiform nuclei, internal capsule, insula, M1-M3 cortex): 7 - Supraganglionic infarction (M4-M6 cortex): 3 Total score (0-10 with 10 being normal): 10 IMPRESSION: 1. Normal noncontrast CT appearance of the brain. 2. ASPECTS is 10. 3. The above was relayed via text pager to Dr. Jerelyn Charles on 05/06/2017 at 14:10 . Electronically Signed   By: Genevie Ann M.D.   On: 05/06/2017 14:11    Procedures Procedures (including critical care time)  Medications Ordered in ED Medications  potassium chloride SA (K-DUR,KLOR-CON) CR tablet 20 mEq (not administered)     Initial Impression / Assessment and Plan / ED Course  I have reviewed the triage vital signs and the nursing notes.  Pertinent labs & imaging results that were available during my care of the patient were reviewed by me and considered in my medical decision making (see chart for details).     Pattijo Juste is a 41 y.o. female With a past medical history significant for hypertension who presents for difficulty speaking and left shoulder pain. Patient reports that she was last normal at 11:30 AM. Patient was at work when she had sudden onset of difficulty speaking. She also reported left shoulder pain. She reports that she went to an urgent care where she was quickly transferred to the emergency department and a code stroke was activated.   On arrival. Neurology team at the bedside. Patient found to have  difficulty speaking in regards to sound volume, not slurred speech or a aphasia. Patient able to answer questions appropriately, just quietly. Patient had no other focal deficits.  Airway was deemed intact and patient taken to CT scanner. CT scan showed no evidence of acute bleed or stroke.  ED and neurology teams completed exam with no focal neurologic deficits discovered otherwise. Physical exam revealed normal strength and sensation Alex remedies. Normal coordination. Normal extraocular movements and pupillary reactions. Speech has decreased volume but no slurred speech present. Patient had clear lungs and nontender chest. Nontender abdomen. No evidence of trauma. Patient has mild tenderness in left shoulder but was able to touch her right shoulder with her left hand, making dislocation less likely. No evidence of trauma on extremity.  After neurology evaluation, they did not feel patient had a stroke or TIA, they did however recommend MRI for confirmation. They suspected psychogenic  etiology of voice change versus conversion disorder.  MRI showed no evidence of stroke or other abnormality.  X-ray of the chest and shoulder showed no abnormalities. Laboratory testing revealed mild hypokalemia, potassium supplementation ordered.  On reassessment, patient had improvement in voice volume. Patient had improvement in shoulder pain. Do not feel patient symptoms are secondary to dissection or cardiac etiology. Given improving symptoms and reassuring evaluation by neurology, patient was felt stable for discharge home. Patient understands return precautions for any new or worsened symptoms. Patient is feeling much better and has better voice speech. Patient will follow up with PCP for further management. Patient has no other questions or concerns and was discharged in good condition.            Final Clinical Impressions(s) / ED Diagnoses   Final diagnoses:  Left shoulder pain  Dysphonia    New  Prescriptions New Prescriptions   No medications on file    Clinical Impression: 1. Dysphonia   2. Left shoulder pain     Disposition: Discharge  Condition: Good  I have discussed the results, Dx and Tx plan with the pt(& family if present). He/she/they expressed understanding and agree(s) with the plan. Discharge instructions discussed at great length. Strict return precautions discussed and pt &/or family have verbalized understanding of the instructions. No further questions at time of discharge.    New Prescriptions   No medications on file    Follow Up: Fanny Bien, [address] [place] [address] [phone]  Schedule an appointment as soon as possible for a visit    [facility] [address] [id number] [place] [phone]  If symptoms worsen     Lyndon Chenoweth, Gwenyth Allegra, MD 05/06/17 2101

## 2017-05-06 NOTE — ED Triage Notes (Addendum)
Pt presents with c/o stroke symptoms. 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 Her symptoms have been constant since onset. She reports left sided neck pain, left sided weakness, slurred speech. She walked to [facility] from the daycare for treatment. Pt taken immediately to room and provider to bedside, code stroke carelink called.

## 2017-12-15 ENCOUNTER — Other Ambulatory Visit: Payer: Self-pay | Admitting: Family Medicine

## 2017-12-15 DIAGNOSIS — Z1231 Encounter for screening mammogram for malignant neoplasm of breast: Secondary | ICD-10-CM

## 2018-01-04 ENCOUNTER — Ambulatory Visit: Payer: 59

## 2018-01-11 ENCOUNTER — Ambulatory Visit
Admission: RE | Admit: 2018-01-11 | Discharge: 2018-01-11 | Disposition: A | Discharge status: Home / Self Care | Payer: 59 | Source: Ambulatory Visit | Attending: Family Medicine | Admitting: Family Medicine

## 2018-01-11 DIAGNOSIS — Z1231 Encounter for screening mammogram for malignant neoplasm of breast: Secondary | ICD-10-CM

## 2018-06-16 ENCOUNTER — Other Ambulatory Visit: Payer: Self-pay

## 2018-06-16 ENCOUNTER — Ambulatory Visit (HOSPITAL_COMMUNITY)
Admission: EM | Admit: 2018-06-16 | Discharge: 2018-06-16 | Disposition: A | Discharge status: Home / Self Care | Payer: Worker's Compensation | Attending: Family Medicine | Admitting: Family Medicine

## 2018-06-16 ENCOUNTER — Ambulatory Visit (INDEPENDENT_AMBULATORY_CARE_PROVIDER_SITE_OTHER): Payer: Worker's Compensation

## 2018-06-16 ENCOUNTER — Encounter (HOSPITAL_COMMUNITY): Payer: Self-pay | Admitting: Emergency Medicine

## 2018-06-16 DIAGNOSIS — S9032XA Contusion of left foot, initial encounter: Secondary | ICD-10-CM

## 2018-06-16 NOTE — ED Triage Notes (Signed)
On Monday a piece of a cabinet at work fell on foot.  Pain shot up her leg.  Piece of cabinet landed on top of foot.  Small bruise, pain , and swelling

## 2018-06-17 NOTE — ED Provider Notes (Signed)
[place]   [id number] 06/16/18 Arrival Time: 6967  ASSESSMENT & PLAN:  1. Contusion of left foot, initial encounter     Imaging: Dg Foot Complete Left  Result Date: 06/16/2018 CLINICAL DATA:  Pain after trauma EXAM: LEFT FOOT - COMPLETE 3+ VIEW COMPARISON:  None. FINDINGS: There is no evidence of fracture or dislocation. There is no evidence of arthropathy or other focal bone abnormality. Soft tissues are unremarkable. IMPRESSION: Negative. Electronically Signed   By: Dorise Bullion III M.D   On: 06/16/2018 18:32   Expect gradual improvement. WBAT. Work note given.  Follow-up Information    Fanny Bien, MD.   Specialty:  Family Medicine Why:  As needed. Contact information: [address] [place] [address] [phone]          Expectations re: course of current medical issues. Questions answered. Outlined signs and symptoms indicating need for more acute intervention. Patient verbalized understanding. After Visit Summary given.  SUBJECTIVE: History from: patient. Alexandra Reed is a 42 y.o. female who reports persistent mild to moderate pain of her left foot that has stabilized since beginning; described as aching without radiation. Onset: abrupt, a few days ago. Injury/trama: yes, reports a cabinet fell onto her foot; immediate discomfort; able to bear weight but with discomfort. Relieved by: rest. Worsened by: prolonged standing. Associated symptoms: none reported. Extremity sensation changes or weakness: none. Self treatment: tried OTCs with overall adequate relief of pain. History of similar: no  ROS: As per HPI.   OBJECTIVE:  Vitals:   06/16/18 1758  BP: 136/82  Pulse: 71  Resp: 18  Temp: 98.5 F (36.9 C)  TempSrc: Oral    General appearance: alert; no distress Extremities: warm and well perfused; symmetrical with no gross deformities; diffuse tenderness over her left distal foot with mild swelling and mild  bruising; ROM around area or areas of discomfort: normal CV: brisk extremity capillary refill; 2+ DP/PT pulses on L Skin: warm and dry Neurologic: normal gait; normal symmetric reflexes in all extremities; normal sensation in all extremities Psychological: alert and cooperative; normal mood and affect  Allergies  Allergen Reactions  . Orange Fruit [Citrus] Hives, Itching and Rash    Past Medical History:  Diagnosis Date  . Hypertension    Social History   Socioeconomic History  . Marital status: Single    Spouse name: Not on file  . Number of children: Not on file  . Years of education: Not on file  . Highest education level: Not on file  Occupational History  . Not on file  Social Needs  . Financial resource strain: Not on file  . Food insecurity:    Worry: Not on file    Inability: Not on file  . Transportation needs:    Medical: Not on file    Non-medical: Not on file  Tobacco Use  . Smoking status: Former Research scientist (life sciences)  . Smokeless tobacco: Never Used  Substance and Sexual Activity  . Alcohol use: Yes    Comment: occasional  . Drug use: No  . Sexual activity: Not on file  Lifestyle  . Physical activity:    Days per week: Not on file    Minutes per session: Not on file  . Stress: Not on file  Relationships  . Social connections:    Talks on phone: Not on file    Gets together: Not on file    Attends religious service: Not on file    Active  member of club or organization: Not on file    Attends meetings of clubs or organizations: Not on file    Relationship status: Not on file  Other Topics Concern  . Not on file  Social History Narrative  . Not on file   Family History  Problem Relation Age of Onset  . Diabetes Other   . Hypertension Other    History reviewed. No pertinent surgical history.    Vanessa Kick, MD 06/30/18 [phone]

## 2019-07-12 ENCOUNTER — Other Ambulatory Visit: Payer: Self-pay | Admitting: Family Medicine

## 2019-07-12 DIAGNOSIS — Z1231 Encounter for screening mammogram for malignant neoplasm of breast: Secondary | ICD-10-CM

## 2019-08-30 ENCOUNTER — Other Ambulatory Visit: Payer: Self-pay

## 2019-08-30 ENCOUNTER — Ambulatory Visit
Admission: RE | Admit: 2019-08-30 | Discharge: 2019-08-30 | Disposition: A | Discharge status: Home / Self Care | Payer: 59 | Source: Ambulatory Visit | Attending: Family Medicine | Admitting: Family Medicine

## 2019-08-30 DIAGNOSIS — Z1231 Encounter for screening mammogram for malignant neoplasm of breast: Secondary | ICD-10-CM

## 2020-06-01 ENCOUNTER — Other Ambulatory Visit: Payer: Self-pay | Admitting: Family Medicine

## 2020-06-01 DIAGNOSIS — Z1231 Encounter for screening mammogram for malignant neoplasm of breast: Secondary | ICD-10-CM

## 2020-08-31 ENCOUNTER — Ambulatory Visit
Admission: RE | Admit: 2020-08-31 | Discharge: 2020-08-31 | Disposition: A | Discharge status: Home / Self Care | Payer: 59 | Source: Ambulatory Visit | Attending: Family Medicine | Admitting: Family Medicine

## 2020-08-31 ENCOUNTER — Other Ambulatory Visit: Payer: Self-pay

## 2020-08-31 DIAGNOSIS — Z1231 Encounter for screening mammogram for malignant neoplasm of breast: Secondary | ICD-10-CM

## 2021-06-24 ENCOUNTER — Other Ambulatory Visit: Payer: Self-pay | Admitting: Family Medicine

## 2021-06-24 DIAGNOSIS — Z1231 Encounter for screening mammogram for malignant neoplasm of breast: Secondary | ICD-10-CM

## 2021-08-01 ENCOUNTER — Ambulatory Visit (INDEPENDENT_AMBULATORY_CARE_PROVIDER_SITE_OTHER): Payer: 59 | Admitting: Gastroenterology

## 2021-08-01 ENCOUNTER — Encounter: Payer: Self-pay | Admitting: Gastroenterology

## 2021-08-01 VITALS — BP 140/82 | HR 83 | Ht 60.0 in | Wt 149.0 lb

## 2021-08-01 DIAGNOSIS — R1030 Lower abdominal pain, unspecified: Secondary | ICD-10-CM | POA: Diagnosis not present

## 2021-08-01 DIAGNOSIS — K625 Hemorrhage of anus and rectum: Secondary | ICD-10-CM

## 2021-08-01 MED ORDER — DICYCLOMINE HCL 10 MG PO CAPS: 10.0000 mg | ORAL_CAPSULE | Freq: Three times a day (TID) | ORAL | 1 refills | Status: AC | PRN

## 2021-08-01 MED ORDER — SUTAB 1479-225-188 MG PO TABS: 1.0000 | ORAL_TABLET | Freq: Once | ORAL | 0 refills | Status: AC

## 2021-08-01 NOTE — Patient Instructions (Addendum)
If you are age 45 or older, your body mass index should be between 23-30. Your Body mass index is 29.1 kg/m. If this is out of the aforementioned range listed, please consider follow up with your Primary Care Provider.  If you are age 86 or younger, your body mass index should be between 19-25. Your Body mass index is 29.1 kg/m. If this is out of the aformentioned range listed, please consider follow up with your Primary Care Provider.   ________________________________________________________  The [facility] providers would like to encourage you to use [facility] to communicate with providers for non-urgent requests or questions.  Due to long hold times on the telephone, sending your provider a message by [facility] may be a faster and more efficient way to get a response.  Please allow 48 business hours for a response.  Please remember that this is for non-urgent requests.  _______________________________________________________  Alexandra Reed have been scheduled for a colonoscopy. Please follow written instructions given to you at your visit today.  Please pick up your prep supplies at the pharmacy within the next 1-3 days. If you use inhalers (even only as needed), please bring them with you on the day of your procedure.  You will be contacted by [facility] in the next 2 days to arrange a CT ABDOMIN AND PELVIS.  The number on your caller ID will be [phone], please answer when they call.  If you have not heard from them in 2 days please call [phone] to schedule.    We have sent the following medications to your pharmacy for you to pick up at your convenience: Bentyl 10 mg: Take every 8 hours as needed  If bleeding reoccurs, use 1% hydrocortisone cream - Use a pea sized amount into the rectum Twice a day as needed  Thank you for entrusting me with your care and for choosing [employer], Dr. Cordova Cellar

## 2021-08-01 NOTE — Progress Notes (Signed)
HPI :  45 year old female with a history of hypertension, internal hemorrhoids, referred by Rachell Cipro, MD for abdominal pain, rectal bleeding.  Patient is new to our practice.  She has a few issues she would like to discuss today.  The main reason she was referred she states was for abdominal pain.  She states this has been going on for a few years now.  She has mid lower abdominal pain, below her umbilicus but above her pelvis.  She initially felt it prior to the onset of her menstrual cycle, although was very different from her normal menses.  Over time it can now happen anytime not necessarily related to her menstrual cycle.  When she gets it it can be quite severe, rated 9-10 out of 10.  It often will 1 to 2 hours and then abate.  The longest is lasted has been a few hours, the shortest has been just short of 1 hour.  She thought initially this may be due to her menstrual cycle but over time she thinks that is less likely at this point.  Relationship of this pain to her bowel habits.  She denies any relationship with eating to this pain.  She has a hard time determining any clear triggers for when this happens to her.  It will happen about 2 times per month and is really bothering her when it occurs.  She denies any NSAID use.  She has had no prior cross-sectional imaging.  She denies any heartburn or reflux symptoms.  No dysphagia.  She is eating okay.  She denies any nausea or vomiting associated with this.  She does have right shoulder pain that bothers her about once per month and can be quite severe as well, she thought initially that may be it is related to her lower abdominal pain in terms of time course.  This will last hours to days at a time and then resolved.    Otherwise, she has recently endorsed some blood in her stool.  Last weekend she states she developed acute onset bright blood blood per rectum with her bowel movements.  She states it was occurring with her bowel movements  over the few days resolved on its own.  She says she has occasional constipation but has not been straining had any constipation recently.  She has never had a prior colonoscopy.  She has no family history of colon cancer.  She denies any perianal pain or rectal pain with bleeding.  She was told by her primary care that she had internal hemorrhoids.  She was given a prescription for lidocaine/hydrocortisone cream but it was not covered by her insurance.      Past Medical History:  Diagnosis Date   Arthritis    Hemorrhoids    Hypertension    Pneumonia      Past Surgical History:  Procedure Laterality Date   oral surgery  2018   WISDOM TOOTH EXTRACTION  2016   Family History  Problem Relation Age of Onset   Hypertension Mother    Hyperlipidemia Mother    Hypertension Father    Diabetes Father    Bladder Cancer Father    Cancer Father    Diabetes Other    Hypertension Other    Social History   Tobacco Use   Smoking status: Former   Smokeless tobacco: Never  Scientific laboratory technician Use: Former  Substance Use Topics   Alcohol use: Yes    Comment: occasional  Drug use: No   Current Outpatient Medications  Medication Sig Dispense Refill   dicyclomine (BENTYL) 10 MG capsule Take 1 capsule (10 mg total) by mouth every 8 (eight) hours as needed for spasms. 10 capsule 1   Sodium Sulfate-Mag Sulfate-KCl (SUTAB) 231 303 0733 MG TABS Take 1 kit by mouth once for 1 dose. 24 tablet 0   valsartan-hydrochlorothiazide (DIOVAN-HCT) 160-12.5 MG tablet Take 1 tablet by mouth daily.     No current facility-administered medications for this visit.   Allergies  Allergen Reactions   Orange Fruit [Citrus] Hives, Itching and Rash     Review of Systems: All systems reviewed and negative except where noted in HPI.   Labs from PCP: 06/06/21: WBC 3.7, HGb 11.5, MCV 86.4, plt 294 ALT 13, AST 23, AP 67, T bil 0.3 TSH 1.71   Physical Exam: BP 140/82   Pulse 83   Ht 5' (1.524 m)   Wt 149  lb (67.6 kg)   SpO2 98%   BMI 29.10 kg/m  Constitutional: Pleasant,well-developed, female in no acute distress. HEENT: Normocephalic and atraumatic. Conjunctivae are normal. No scleral icterus. Neck supple.  Cardiovascular: Normal rate, regular rhythm.  Pulmonary/chest: Effort normal and breath sounds normal.  Abdominal: Soft, nondistended, nontender.  There are no masses palpable. No hepatomegaly. DRE / Anoscopy - no fissure, no mass lesions, internal hemorrhoids on anoscopy - CMA Tia Alert standby Extremities: no edema Lymphadenopathy: No cervical adenopathy noted. Neurological: Alert and oriented to person place and time. Skin: Skin is warm and dry. No rashes noted. Psychiatric: Normal mood and affect. Behavior is normal.   ASSESSMENT AND PLAN: 45 year old female here for new patient evaluation of the following:  Rectal bleeding Abdominal pain  Fairly new onset rectal bleeding, multiple episodes of bright red blood per rectum without any clear precipitants, constipation, or straining.  On anoscopy she does have some internal hemorrhoids which are noted but nothing with stigmata for recent bleeding.  Fortunately this has since resolved.  Given her age and she has never had a prior colonoscopy, recommending colonoscopy to complete her screening and to evaluate her rectal bleeding.  I discussed risks and benefits of colonoscopy and anesthesia with her and she wanted to proceed.  Further recommendations pending the results.  In the interim she can use 1% hydrocortisone cream over-the-counter, pea-sized amount applied PR twice daily if bleeding recurs.  Otherwise, we reviewed her history of abdominal pain which has been ongoing for some time now.  It sounds like very severe episodes of lower abdominal pain without any clear triggers that is rather debilitating for her when she gets it, it occurs at least a few times a month at this point.  Initially she thought related to her menstrual cycle  but now occurring outside of those episodes.  It has no relationship to her bowel habits, I think less likely that colonoscopy will find a clear cause for this.  CT scan abdomen pelvis would be the best way to evaluate this given its hard to pin down based on history alone would is driving this.  She strongly wanted to have a CT scan done as she has never had prior cross-sectional imaging.  We will await that result as well as that of her colonoscopy.  I offered her some Bentyl empirically to see if this could help abort an episode when she gets it although unclear if it will provide any benefit.  She wishes to try it.  Plan: - schedule a colonoscopy - reschedule CT  abdomen / pelvis with contrast - empiric trial of Bentyl 56m PRN for episodes of pain to see if it helps - if bleeding recurs try 1% hyodrocortisone cream - pea sized amount PR BID - she inquires about her right shoulder pain as reviewed above, unclear what is causing that but seems less likely to be related to an upper GI tract process at this time.  SJolly Mango MD [facility]  CC: DFanny Bien MD

## 2021-08-19 ENCOUNTER — Telehealth: Payer: Self-pay | Admitting: Gastroenterology

## 2021-08-19 NOTE — Telephone Encounter (Signed)
Script was sent to [facility] on 11-17. Called [employer] and left detailed message to call me back regarding patient's prescription. She needs it BY Thursday.

## 2021-08-19 NOTE — Telephone Encounter (Signed)
Inbound call from patient states need sutab prep [facility] to [facility] [address]

## 2021-08-20 NOTE — Telephone Encounter (Signed)
Called and spoke to [employer].  They had the wrong # for the patient or they could not get through on the number we provided: [phone]. Spoke to [employer]. They understand patient needs scrip by Thursday and will either overnight to her or transfer to [facility] local [facility] on [address]. They will contact patient on her cell: [phone]

## 2021-08-23 ENCOUNTER — Ambulatory Visit (AMBULATORY_SURGERY_CENTER): Payer: 59 | Admitting: Gastroenterology

## 2021-08-23 ENCOUNTER — Other Ambulatory Visit: Payer: Self-pay

## 2021-08-23 ENCOUNTER — Encounter: Payer: Self-pay | Admitting: Gastroenterology

## 2021-08-23 VITALS — BP 124/78 | HR 83 | Temp 97.1°F | Resp 19 | Ht 60.0 in | Wt 149.0 lb

## 2021-08-23 DIAGNOSIS — R1030 Lower abdominal pain, unspecified: Secondary | ICD-10-CM | POA: Diagnosis not present

## 2021-08-23 DIAGNOSIS — K648 Other hemorrhoids: Secondary | ICD-10-CM | POA: Diagnosis not present

## 2021-08-23 DIAGNOSIS — D123 Benign neoplasm of transverse colon: Secondary | ICD-10-CM

## 2021-08-23 DIAGNOSIS — K635 Polyp of colon: Secondary | ICD-10-CM

## 2021-08-23 DIAGNOSIS — K625 Hemorrhage of anus and rectum: Secondary | ICD-10-CM | POA: Diagnosis not present

## 2021-08-23 MED ORDER — SODIUM CHLORIDE 0.9 % IV SOLN: 500.0000 mL | Freq: Once | INTRAVENOUS | Status: DC

## 2021-08-23 NOTE — Progress Notes (Signed)
VSS, Transported to PACU °

## 2021-08-23 NOTE — Progress Notes (Signed)
Pt. And son waiting in consultation room in PACU until [employer]'s arrival confirmed.  Hence delayed D/C time.

## 2021-08-23 NOTE — Progress Notes (Signed)
History and Physical Interval Note: seen in clinic 08/01/21 - no interval changes. No further bleeding since I have seen her. She wishes to proceed with colonoscopy after discussion of risks / benefits.   08/23/2021 2:54 PM  Alexandra Reed  has presented today for endoscopic procedure(s), with the diagnosis of  Encounter Diagnoses  Name Primary?   Rectal bleeding Yes   Lower abdominal pain   .  The various methods of evaluation and treatment have been discussed with the patient and/or family. After consideration of risks, benefits and other options for treatment, the patient has consented to  the endoscopic procedure(s).   The patient's history has been reviewed, patient examined, no change in status, stable for surgery.  I have reviewed the patient's chart and labs.  Questions were answered to the patient's satisfaction.    Jolly Mango, MD [facility]

## 2021-08-23 NOTE — Op Note (Signed)
[place] Patient Name: Alexandra Reed Procedure Date: 08/23/2021 2:45 PM MRN: [id number] Endoscopist: Remo Lipps P. Havery Moros , MD Age: 45 Referring MD:  Date of Birth: 08-27-1976 Gender: Female Account #: [id number] Procedure:                Colonoscopy Indications:              history of Rectal bleeding - first colonoscopy,                            intermittent abdominal pain Medicines:                Monitored Anesthesia Care Procedure:                Pre-Anesthesia Assessment:                           - Prior to the procedure, a History and Physical                            was performed, and patient medications and                            allergies were reviewed. The patient's tolerance of                            previous anesthesia was also reviewed. The risks                            and benefits of the procedure and the sedation                            options and risks were discussed with the patient.                            All questions were answered, and informed consent                            was obtained. Prior Anticoagulants: The patient has                            taken no previous anticoagulant or antiplatelet                            agents. ASA Grade Assessment: I - A normal, healthy                            patient. After reviewing the risks and benefits,                            the patient was deemed in satisfactory condition to                            undergo the procedure.  After obtaining informed consent, the colonoscope                            was passed under direct vision. Throughout the                            procedure, the patient's blood pressure, pulse, and                            oxygen saturations were monitored continuously. The                            Olympus PCF-H190DL (#[id number]) Colonoscope was                            introduced through the anus and advanced to  the the                            terminal ileum, with identification of the                            appendiceal orifice and IC valve. The colonoscopy                            was performed without difficulty. The patient                            tolerated the procedure well. The quality of the                            bowel preparation was good. The terminal ileum,                            ileocecal valve, appendiceal orifice, and rectum                            were photographed. Scope In: 2:57:18 PM Scope Out: 3:10:46 PM Scope Withdrawal Time: 0 hours 9 minutes 33 seconds  Total Procedure Duration: 0 hours 13 minutes 28 seconds  Findings:                 The perianal and digital rectal examinations were                            normal.                           The terminal ileum appeared normal.                           A 4 mm polyp was found in the splenic flexure. The                            polyp was sessile. The polyp was removed with a  cold snare. Resection and retrieval were complete.                           Internal hemorrhoids were found during retroflexion.                           The exam was otherwise without abnormality. Complications:            No immediate complications. Estimated blood loss:                            Minimal. Estimated Blood Loss:     Estimated blood loss was minimal. Impression:               - The examined portion of the ileum was normal.                           - One 4 mm polyp at the splenic flexure, removed                            with a cold snare. Resected and retrieved.                           - Internal hemorrhoids.                           - The examination was otherwise normal.                           Hemorrhoids are the cause of prior bleeding                            symptoms. No cause for pain on this exam, CT scan                            abdomen has been  scheduled. Recommendation:           - Patient has a contact number available for                            emergencies. The signs and symptoms of potential                            delayed complications were discussed with the                            patient. Return to normal activities tomorrow.                            Written discharge instructions were provided to the                            patient.                           - Resume previous diet.                           -  Continue present medications.                           - Await pathology results.                           - Await pending CT scan                           - If bleeding recurs try 1% hyodrocortisone cream -                            pea sized amount applied per rectum twice daily Brodi Kari P. Irma Roulhac, MD 08/23/2021 3:15:28 PM This report has been signed electronically.

## 2021-08-23 NOTE — Progress Notes (Signed)
Called to room to assist during endoscopic procedure.  Patient ID and intended procedure confirmed with present staff. Received instructions for my participation in the procedure from the performing physician.  

## 2021-08-23 NOTE — Progress Notes (Signed)
VS-DT 

## 2021-08-23 NOTE — Patient Instructions (Addendum)
Impression/Recommendations:  Polyp and hemorrhoid handouts given to patient.  Resume previous diet. Continue present medications. Await pathology results.  Await pending CT scan.  If bleeding recurs, try 1% hydrocortisone cream - pea sized amount applied per rectum twice daily.  YOU HAD AN ENDOSCOPIC PROCEDURE TODAY AT [facility]:   Refer to the procedure report that was given to you for any specific questions about what was found during the examination.  If the procedure report does not answer your questions, please call your gastroenterologist to clarify.  If you requested that your care partner not be given the details of your procedure findings, then the procedure report has been included in a sealed envelope for you to review at your convenience later.  YOU SHOULD EXPECT: Some feelings of bloating in the abdomen. Passage of more gas than usual.  Walking can help get rid of the air that was put into your GI tract during the procedure and reduce the bloating. If you had a lower endoscopy (such as a colonoscopy or flexible sigmoidoscopy) you may notice spotting of blood in your stool or on the toilet paper. If you underwent a bowel prep for your procedure, you may not have a normal bowel movement for a few days.  Please Note:  You might notice some irritation and congestion in your nose or some drainage.  This is from the oxygen used during your procedure.  There is no need for concern and it should clear up in a day or so.  SYMPTOMS TO REPORT IMMEDIATELY:  Following lower endoscopy (colonoscopy or flexible sigmoidoscopy):  Excessive amounts of blood in the stool  Significant tenderness or worsening of abdominal pains  Swelling of the abdomen that is new, acute  Fever of 100F or higher  For urgent or emergent issues, a gastroenterologist can be reached at any hour by calling [phone]. Do not use MyChart messaging for urgent concerns.    DIET:  We do recommend  a small meal at first, but then you may proceed to your regular diet.  Drink plenty of fluids but you should avoid alcoholic beverages for 24 hours.  ACTIVITY:  You should plan to take it easy for the rest of today and you should NOT DRIVE or use heavy machinery until tomorrow (because of the sedation medicines used during the test).    FOLLOW UP: Our staff will call the number listed on your records 48-72 hours following your procedure to check on you and address any questions or concerns that you may have regarding the information given to you following your procedure. If we do not reach you, we will leave a message.  We will attempt to reach you two times.  During this call, we will ask if you have developed any symptoms of COVID 19. If you develop any symptoms (ie: fever, flu-like symptoms, shortness of breath, cough etc.) before then, please call [phone].  If you test positive for Covid 19 in the 2 weeks post procedure, please call and report this information to [place].    If any biopsies were taken you will be contacted by phone or by letter within the next 1-3 weeks.  Please call us at [phone] if you have not heard about the biopsies in 3 weeks.    SIGNATURES/CONFIDENTIALITY: You and/or your care partner have signed paperwork which will be entered into your electronic medical record.  These signatures attest to the fact that that the information above on your After Visit Summary  has been reviewed and is understood.  Full responsibility of the confidentiality of this discharge information lies with you and/or your care-partner.  

## 2021-08-27 ENCOUNTER — Emergency Department (HOSPITAL_COMMUNITY)
Admission: EM | Admit: 2021-08-27 | Discharge: 2021-08-28 | Disposition: A | Discharge status: Home / Self Care | Payer: 59 | Attending: Physician Assistant | Admitting: Physician Assistant

## 2021-08-27 ENCOUNTER — Telehealth: Payer: Self-pay | Admitting: *Deleted

## 2021-08-27 ENCOUNTER — Encounter (HOSPITAL_COMMUNITY): Payer: Self-pay

## 2021-08-27 ENCOUNTER — Other Ambulatory Visit: Payer: Self-pay

## 2021-08-27 DIAGNOSIS — R42 Dizziness and giddiness: Secondary | ICD-10-CM | POA: Diagnosis present

## 2021-08-27 DIAGNOSIS — Z5321 Procedure and treatment not carried out due to patient leaving prior to being seen by health care provider: Secondary | ICD-10-CM | POA: Insufficient documentation

## 2021-08-27 DIAGNOSIS — R002 Palpitations: Secondary | ICD-10-CM | POA: Insufficient documentation

## 2021-08-27 DIAGNOSIS — R079 Chest pain, unspecified: Secondary | ICD-10-CM | POA: Diagnosis not present

## 2021-08-27 LAB — CBC WITH DIFFERENTIAL/PLATELET
Abs Immature Granulocytes: 0.01 10*3/uL (ref 0.00–0.07)
Basophils Absolute: 0.1 10*3/uL (ref 0.0–0.1)
Basophils Relative: 1 %
Eosinophils Absolute: 0.1 10*3/uL (ref 0.0–0.5)
Eosinophils Relative: 3 %
HCT: 41.7 % (ref 36.0–46.0)
Hemoglobin: 13.8 g/dL (ref 12.0–15.0)
Immature Granulocytes: 0 %
Lymphocytes Relative: 43 %
Lymphs Abs: 2 10*3/uL (ref 0.7–4.0)
MCH: 28.8 pg (ref 26.0–34.0)
MCHC: 33.1 g/dL (ref 30.0–36.0)
MCV: 87.1 fL (ref 80.0–100.0)
Monocytes Absolute: 0.5 10*3/uL (ref 0.1–1.0)
Monocytes Relative: 11 %
Neutro Abs: 1.9 10*3/uL (ref 1.7–7.7)
Neutrophils Relative %: 42 %
Platelets: 320 10*3/uL (ref 150–400)
RBC: 4.79 MIL/uL (ref 3.87–5.11)
RDW: 13.2 % (ref 11.5–15.5)
WBC: 4.5 10*3/uL (ref 4.0–10.5)
nRBC: 0 % (ref 0.0–0.2)

## 2021-08-27 NOTE — Telephone Encounter (Signed)
First follow up call attempt.  LVM. 

## 2021-08-27 NOTE — ED Provider Notes (Signed)
Emergency Medicine Provider Triage Evaluation Note  Alexandra Reed , a 45 y.o. female  was evaluated in triage.  Pt complains of dizziness, lightheadedness, chest pain, palpitations.  Had colonoscopy on Friday.  Did not have an EGD performed.  It did remove 1 polyp.  Has been trying to drink more fluids since then however still feels dizzy and like her heart is intermittently racing.  Does also have some intermittent chest tightness which does not radiate.  No cough, hemoptysis.  She is not having any bloody stools.  No abdominal pain. No LE edema, hx of DVT, PE  Review of Systems  Positive: Dizziness, chest pain, palpitation Negative: Cough, hemoptysis, hematemesis, abdominal pain, bloody stool  Physical Exam  BP 111/87 (BP Location: Right Arm)    Pulse 85    Temp 97.6 F (36.4 C)    Resp 14    LMP 08/16/2021 (Exact Date)    SpO2 100%  Gen:   Awake, no distress   Resp:  Normal effort MSK:   Moves extremities without difficulty, no LE edema Other:    Medical Decision Making  Medically screening exam initiated at 10:51 PM.  Appropriate orders placed.  KESHANA KLEMZ was informed that the remainder of the evaluation will be completed by another provider, this initial triage assessment does not replace that evaluation, and the importance of remaining in the ED until their evaluation is complete.  Palpitations, lightheadedness, chest pain   Reesha Debes A, PA-C 08/27/21 2251    Truddie Hidden, MD 08/29/21 [phone]

## 2021-08-27 NOTE — Telephone Encounter (Signed)
°  Follow up Call-  Call back number 08/23/2021  Post procedure Call Back phone  # [phone]  Permission to leave phone message Yes  Some recent data might be hidden     Patient questions:  Do you have a fever, pain , or abdominal swelling? No. Pain Score  0 *  Have you tolerated food without any problems? No.  Have you been able to return to your normal activities? No.  Do you have any questions about your discharge instructions: Diet   No. Medications  No. Follow up visit  No.  Do you have questions or concerns about your Care? No.  Actions: * If pain score is 4 or above: No action needed, pain <4.  Pt had runny nose, sneezing and dizziness intermittently since her procedure and then nausea the last few days. Told her to follow up with her PCP.  Have you developed a fever since your procedure? no  2.   Have you had an respiratory symptoms (SOB or cough) since your procedure? no  3.   Have you tested positive for COVID 19 since your procedure no  4.   Have you had any family members/close contacts diagnosed with the COVID 19 since your procedure?  no   If yes to any of these questions please route to Joylene John, RN and Joella Prince, RN

## 2021-08-27 NOTE — ED Triage Notes (Signed)
Pt stated that she had a colonoscopy this past Friday and has been dizzy and nauseous since the procedure. Pt states that her heart has been racing. Pt also endorsing hearing loss in the right ear, which also came after the procedure. Pt also endorsing post nasal drainage, which she feels is worse from post sedation from colonoscopy.

## 2021-08-28 ENCOUNTER — Emergency Department (HOSPITAL_COMMUNITY): Payer: 59

## 2021-08-28 ENCOUNTER — Other Ambulatory Visit: Payer: Self-pay

## 2021-08-28 DIAGNOSIS — R42 Dizziness and giddiness: Secondary | ICD-10-CM | POA: Diagnosis not present

## 2021-08-28 LAB — URINALYSIS, ROUTINE W REFLEX MICROSCOPIC
Bilirubin Urine: NEGATIVE
Glucose, UA: NEGATIVE mg/dL
Hgb urine dipstick: NEGATIVE
Ketones, ur: NEGATIVE mg/dL
Leukocytes,Ua: NEGATIVE
Nitrite: NEGATIVE
Protein, ur: NEGATIVE mg/dL
Specific Gravity, Urine: <1.005 — ABNORMAL LOW (ref 1.005–1.030)
pH: 5.5 (ref 5.0–8.0)

## 2021-08-28 LAB — MAGNESIUM: Magnesium: 2.2 mg/dL (ref 1.7–2.4)

## 2021-08-28 LAB — I-STAT BETA HCG BLOOD, ED (MC, WL, AP ONLY): I-stat hCG, quantitative: <5 m[IU]/mL (ref <5)

## 2021-08-28 LAB — BASIC METABOLIC PANEL
Anion gap: 7 (ref 5–15)
BUN: 12 mg/dL (ref 6–20)
CO2: 26 mmol/L (ref 22–32)
Calcium: 9.3 mg/dL (ref 8.9–10.3)
Chloride: 102 mmol/L (ref 98–111)
Creatinine, Ser: 0.89 mg/dL (ref 0.44–1.00)
GFR, Estimated: >60 mL/min (ref >60)
Glucose, Bld: 108 mg/dL — ABNORMAL HIGH (ref 70–99)
Potassium: 3.5 mmol/L (ref 3.5–5.1)
Sodium: 135 mmol/L (ref 135–145)

## 2021-08-28 LAB — TROPONIN I (HIGH SENSITIVITY): Troponin I (High Sensitivity): 3 ng/L (ref <18)

## 2021-08-28 NOTE — ED Notes (Signed)
Pt decided to leave while waiting for a room.  

## 2021-08-29 ENCOUNTER — Encounter: Payer: Self-pay | Admitting: Gastroenterology

## 2021-08-30 ENCOUNTER — Other Ambulatory Visit: Payer: Self-pay

## 2021-08-30 ENCOUNTER — Emergency Department (HOSPITAL_COMMUNITY)
Admission: EM | Admit: 2021-08-30 | Discharge: 2021-08-30 | Disposition: A | Discharge status: Home / Self Care | Payer: 59 | Attending: Student | Admitting: Student

## 2021-08-30 DIAGNOSIS — I10 Essential (primary) hypertension: Secondary | ICD-10-CM | POA: Insufficient documentation

## 2021-08-30 DIAGNOSIS — R42 Dizziness and giddiness: Secondary | ICD-10-CM | POA: Insufficient documentation

## 2021-08-30 DIAGNOSIS — Z79899 Other long term (current) drug therapy: Secondary | ICD-10-CM | POA: Insufficient documentation

## 2021-08-30 DIAGNOSIS — Z87891 Personal history of nicotine dependence: Secondary | ICD-10-CM | POA: Diagnosis not present

## 2021-08-30 LAB — CBC
HCT: 40.4 % (ref 36.0–46.0)
Hemoglobin: 13.3 g/dL (ref 12.0–15.0)
MCH: 28.4 pg (ref 26.0–34.0)
MCHC: 32.9 g/dL (ref 30.0–36.0)
MCV: 86.3 fL (ref 80.0–100.0)
Platelets: 356 10*3/uL (ref 150–400)
RBC: 4.68 MIL/uL (ref 3.87–5.11)
RDW: 13.2 % (ref 11.5–15.5)
WBC: 4.6 10*3/uL (ref 4.0–10.5)
nRBC: 0 % (ref 0.0–0.2)

## 2021-08-30 LAB — BASIC METABOLIC PANEL
Anion gap: 8 (ref 5–15)
BUN: 12 mg/dL (ref 6–20)
CO2: 25 mmol/L (ref 22–32)
Calcium: 9 mg/dL (ref 8.9–10.3)
Chloride: 101 mmol/L (ref 98–111)
Creatinine, Ser: 1.14 mg/dL — ABNORMAL HIGH (ref 0.44–1.00)
GFR, Estimated: >60 mL/min (ref >60)
Glucose, Bld: 112 mg/dL — ABNORMAL HIGH (ref 70–99)
Potassium: 3.3 mmol/L — ABNORMAL LOW (ref 3.5–5.1)
Sodium: 134 mmol/L — ABNORMAL LOW (ref 135–145)

## 2021-08-30 LAB — URINALYSIS, ROUTINE W REFLEX MICROSCOPIC
Bilirubin Urine: NEGATIVE
Glucose, UA: NEGATIVE mg/dL
Hgb urine dipstick: NEGATIVE
Ketones, ur: NEGATIVE mg/dL
Leukocytes,Ua: NEGATIVE
Nitrite: NEGATIVE
Protein, ur: NEGATIVE mg/dL
Specific Gravity, Urine: <1.005 — ABNORMAL LOW (ref 1.005–1.030)
pH: 5.5 (ref 5.0–8.0)

## 2021-08-30 MED ORDER — MECLIZINE HCL 25 MG PO TABS
25.0000 mg | ORAL_TABLET | Freq: Once | ORAL | Status: AC
  Administered 2021-08-30: 25 mg via ORAL
  Filled 2021-08-30: qty 1

## 2021-08-30 MED ORDER — LACTATED RINGERS IV BOLUS
1000.0000 mL | Freq: Once | INTRAVENOUS | Status: AC
  Administered 2021-08-30: 1000 mL via INTRAVENOUS

## 2021-08-30 MED ORDER — MECLIZINE HCL 25 MG PO TABS: 25.0000 mg | ORAL_TABLET | Freq: Three times a day (TID) | ORAL | 0 refills | Status: AC | PRN

## 2021-08-30 NOTE — ED Triage Notes (Signed)
Patient complains of continued dizziness, nausea, seen for same this passed Tuesday. Patient also reports on Tuesday that she was having difficulty hearing in right ear but states that seems to have resolved. Patient alert, oriented, no arm weakness, no sensation deficits, ambulatory and in no apparent distress at this time.

## 2021-08-30 NOTE — ED Notes (Signed)
Reported heart monitor alerts to treatment team and checked patient

## 2021-08-30 NOTE — ED Provider Notes (Signed)
[facility] EMERGENCY DEPARTMENT Provider Note   CSN: [id number] Arrival date & time: 08/30/21  0747     History Chief Complaint  Patient presents with   Dizziness    Alexandra Reed is a 45 y.o. female with PMH depression, HTN who presents emergency department for evaluation of vertigo.  Patient states that she recently had a colonoscopy last week and since completing the bowel prep she has had intermittent but persistent vertigo.  She states that the room spins when she goes from sitting to standing and when she turns her head to side to side.  Denies numbness, tingling, weakness or other neurologic complaints.  Patient presented to the emergency department 3 days ago but left without being seen due to long wait times.  She denies chest pain, shortness of breath, diarrhea, headache, fever or other systemic symptoms.   Dizziness Associated symptoms: no chest pain, no palpitations, no shortness of breath and no vomiting       Past Medical History:  Diagnosis Date   Arthritis    Depression    Hemorrhoids    Hypertension    Pneumonia     There are no problems to display for this patient.   Past Surgical History:  Procedure Laterality Date   COLONOSCOPY     WISDOM TOOTH EXTRACTION  2016     OB History   No obstetric history on file.     Family History  Problem Relation Age of Onset   Hypertension Mother    Hyperlipidemia Mother    Hypertension Father    Diabetes Father    Bladder Cancer Father    Cancer Father    Diabetes Other    Hypertension Other    Colon cancer Neg Hx    Esophageal cancer Neg Hx    Stomach cancer Neg Hx    Rectal cancer Neg Hx     Social History   Tobacco Use   Smoking status: Former   Smokeless tobacco: Never  Scientific laboratory technician Use: Former  Substance Use Topics   Alcohol use: Yes    Comment: occasional   Drug use: No    Home Medications Prior to Admission medications   Medication Sig Start Date End  Date Taking? Authorizing Provider  acetaminophen (TYLENOL) 120 MG suppository Place 120 mg rectally every 4 (four) hours as needed.   Yes [provider]  meclizine (ANTIVERT) 25 MG tablet Take 1 tablet (25 mg total) by mouth 3 (three) times daily as needed for dizziness. 08/30/21  Yes Shawnte Winton, MD  Azelastine HCl 137 MCG/SPRAY SOLN Place into both nostrils. 06/11/21   [provider]  dicyclomine (BENTYL) 10 MG capsule Take 1 capsule (10 mg total) by mouth every 8 (eight) hours as needed for spasms. Patient not taking: Reported on 08/23/2021 08/01/21   Yetta Flock, MD  ibuprofen (ADVIL) 800 MG tablet Take by mouth.    [provider]  naproxen (NAPROSYN) 500 MG tablet Take 500 mg by mouth 2 (two) times daily. 05/02/21   [provider]  omeprazole (PRILOSEC) 40 MG capsule Take by mouth. Patient not taking: Reported on 08/23/2021 07/27/21   [provider]  valsartan-hydrochlorothiazide (DIOVAN-HCT) 160-12.5 MG tablet Take 1 tablet by mouth daily.    [provider]    Allergies    Orange fruit [citrus]  Review of Systems   Review of Systems  Constitutional:  Negative for chills and fever.  HENT:  Negative for ear  pain and sore throat.   Eyes:  Negative for pain and visual disturbance.  Respiratory:  Negative for cough and shortness of breath.   Cardiovascular:  Negative for chest pain and palpitations.  Gastrointestinal:  Negative for abdominal pain and vomiting.  Genitourinary:  Negative for dysuria and hematuria.  Musculoskeletal:  Negative for arthralgias and back pain.  Skin:  Negative for color change and rash.  Neurological:  Positive for dizziness. Negative for seizures and syncope.  All other systems reviewed and are negative.  Physical Exam Updated Vital Signs BP 98/76    Pulse 76    Temp 98 F (36.7 C) (Oral)    Resp 18    LMP 08/16/2021 (Exact Date)    SpO2 98%   Physical Exam Vitals and nursing note  reviewed.  Constitutional:      General: She is not in acute distress.    Appearance: She is well-developed.  HENT:     Head: Normocephalic and atraumatic.  Eyes:     Conjunctiva/sclera: Conjunctivae normal.  Cardiovascular:     Rate and Rhythm: Normal rate and regular rhythm.     Heart sounds: No murmur heard. Pulmonary:     Effort: Pulmonary effort is normal. No respiratory distress.     Breath sounds: Normal breath sounds.  Abdominal:     Palpations: Abdomen is soft.     Tenderness: There is no abdominal tenderness.  Musculoskeletal:        General: No swelling.     Cervical back: Neck supple.  Skin:    General: Skin is warm and dry.     Capillary Refill: Capillary refill takes less than 2 seconds.  Neurological:     Mental Status: She is alert.     Cranial Nerves: No cranial nerve deficit.     Sensory: No sensory deficit.     Motor: No weakness.  Psychiatric:        Mood and Affect: Mood normal.    ED Results / Procedures / Treatments   Labs (all labs ordered are listed, but only abnormal results are displayed) Labs Reviewed  BASIC METABOLIC PANEL - Abnormal; Notable for the following components:      Result Value   Sodium 134 (*)    Potassium 3.3 (*)    Glucose, Bld 112 (*)    Creatinine, Ser 1.14 (*)    All other components within normal limits  URINALYSIS, ROUTINE W REFLEX MICROSCOPIC - Abnormal; Notable for the following components:   Specific Gravity, Urine <1.005 (*)    All other components within normal limits  CBC    EKG None  Radiology No results found.  Procedures Procedures   Medications Ordered in ED Medications  lactated ringers bolus 1,000 mL (0 mLs Intravenous Stopped 08/30/21 1051)  meclizine (ANTIVERT) tablet 25 mg (25 mg Oral Given 08/30/21 9826)    ED Course  I have reviewed the triage vital signs and the nursing notes.  Pertinent labs & imaging results that were available during my care of the patient were reviewed by me and  considered in my medical decision making (see chart for details).    MDM Rules/Calculators/A&P                          Patient seen emergency [facility] for evaluation of dizziness.  Physical exam is unremarkable with a normal neurologic exam, no focal motor or sensory deficits.  Patient has nystagmus on left-sided gaze and with Dix-Hallpike.  Patient given Antivert and fluid resuscitation here and on reevaluation, her symptoms have completely resolved.  Suspect element of benign positional vertigo as well as dehydration after significant bowel prep.  Patient was given a prescription for Antivert and instructed to follow-up with a primary care physician.  Low suspicion for central stroke as the patient's dizziness is fatigable with no focal deficits.   Final Clinical Impression(s) / ED Diagnoses Final diagnoses:  Vertigo    Rx / DC Orders ED Discharge Orders          Ordered    meclizine (ANTIVERT) 25 MG tablet  3 times daily PRN        08/30/21 1034             Riki Gehring, Debe Coder, MD 08/30/21 1608

## 2021-09-02 ENCOUNTER — Ambulatory Visit (HOSPITAL_COMMUNITY)
Admission: RE | Admit: 2021-09-02 | Discharge: 2021-09-02 | Disposition: A | Discharge status: Home / Self Care | Payer: 59 | Source: Ambulatory Visit | Attending: Gastroenterology | Admitting: Gastroenterology

## 2021-09-02 ENCOUNTER — Ambulatory Visit
Admission: RE | Admit: 2021-09-02 | Discharge: 2021-09-02 | Disposition: A | Discharge status: Home / Self Care | Payer: 59 | Source: Ambulatory Visit | Attending: Family Medicine | Admitting: Family Medicine

## 2021-09-02 ENCOUNTER — Other Ambulatory Visit: Payer: Self-pay

## 2021-09-02 ENCOUNTER — Ambulatory Visit: Payer: 59

## 2021-09-02 DIAGNOSIS — K625 Hemorrhage of anus and rectum: Secondary | ICD-10-CM | POA: Diagnosis not present

## 2021-09-02 DIAGNOSIS — R1030 Lower abdominal pain, unspecified: Secondary | ICD-10-CM | POA: Insufficient documentation

## 2021-09-02 DIAGNOSIS — Z1231 Encounter for screening mammogram for malignant neoplasm of breast: Secondary | ICD-10-CM

## 2021-09-02 MED ORDER — IOHEXOL 350 MG/ML SOLN
75.0000 mL | Freq: Once | INTRAVENOUS | Status: AC | PRN
  Administered 2021-09-02: 10:00:00 75 mL via INTRAVENOUS

## 2021-09-02 MED ORDER — SODIUM CHLORIDE (PF) 0.9 % IJ SOLN
INTRAMUSCULAR | Status: AC
  Filled 2021-09-02: qty 50

## 2021-09-04 ENCOUNTER — Other Ambulatory Visit: Payer: Self-pay

## 2021-09-04 DIAGNOSIS — R1030 Lower abdominal pain, unspecified: Secondary | ICD-10-CM

## 2021-09-04 DIAGNOSIS — N3289 Other specified disorders of bladder: Secondary | ICD-10-CM

## 2021-12-10 DIAGNOSIS — Z23 Encounter for immunization: Secondary | ICD-10-CM | POA: Diagnosis not present

## 2022-01-23 DIAGNOSIS — H1045 Other chronic allergic conjunctivitis: Secondary | ICD-10-CM | POA: Diagnosis not present

## 2022-01-23 DIAGNOSIS — H524 Presbyopia: Secondary | ICD-10-CM | POA: Diagnosis not present

## 2022-02-24 DIAGNOSIS — R42 Dizziness and giddiness: Secondary | ICD-10-CM | POA: Diagnosis not present

## 2022-02-24 DIAGNOSIS — I10 Essential (primary) hypertension: Secondary | ICD-10-CM | POA: Diagnosis not present

## 2022-04-08 DIAGNOSIS — I10 Essential (primary) hypertension: Secondary | ICD-10-CM | POA: Diagnosis not present

## 2022-04-08 DIAGNOSIS — M7061 Trochanteric bursitis, right hip: Secondary | ICD-10-CM | POA: Diagnosis not present

## 2022-04-08 DIAGNOSIS — M7062 Trochanteric bursitis, left hip: Secondary | ICD-10-CM | POA: Diagnosis not present

## 2022-06-04 DIAGNOSIS — M25559 Pain in unspecified hip: Secondary | ICD-10-CM | POA: Diagnosis not present

## 2022-06-04 DIAGNOSIS — I10 Essential (primary) hypertension: Secondary | ICD-10-CM | POA: Diagnosis not present

## 2022-06-04 DIAGNOSIS — M545 Low back pain, unspecified: Secondary | ICD-10-CM | POA: Diagnosis not present

## 2022-06-09 DIAGNOSIS — I10 Essential (primary) hypertension: Secondary | ICD-10-CM | POA: Diagnosis not present

## 2022-06-09 DIAGNOSIS — M545 Low back pain, unspecified: Secondary | ICD-10-CM | POA: Diagnosis not present

## 2022-06-11 DIAGNOSIS — Z8669 Personal history of other diseases of the nervous system and sense organs: Secondary | ICD-10-CM | POA: Diagnosis not present

## 2022-06-13 DIAGNOSIS — Z114 Encounter for screening for human immunodeficiency virus [HIV]: Secondary | ICD-10-CM | POA: Diagnosis not present

## 2022-06-13 DIAGNOSIS — M7072 Other bursitis of hip, left hip: Secondary | ICD-10-CM | POA: Diagnosis not present

## 2022-06-13 DIAGNOSIS — I10 Essential (primary) hypertension: Secondary | ICD-10-CM | POA: Diagnosis not present

## 2022-06-13 DIAGNOSIS — Z0000 Encounter for general adult medical examination without abnormal findings: Secondary | ICD-10-CM | POA: Diagnosis not present

## 2022-06-13 DIAGNOSIS — M545 Low back pain, unspecified: Secondary | ICD-10-CM | POA: Diagnosis not present

## 2022-06-17 DIAGNOSIS — Z0000 Encounter for general adult medical examination without abnormal findings: Secondary | ICD-10-CM | POA: Diagnosis not present

## 2022-06-19 ENCOUNTER — Other Ambulatory Visit: Payer: Self-pay | Admitting: Family Medicine

## 2022-06-19 DIAGNOSIS — Z1231 Encounter for screening mammogram for malignant neoplasm of breast: Secondary | ICD-10-CM

## 2022-09-03 ENCOUNTER — Ambulatory Visit: Payer: 59

## 2022-09-05 ENCOUNTER — Ambulatory Visit
Admission: RE | Admit: 2022-09-05 | Discharge: 2022-09-05 | Disposition: A | Discharge status: Home / Self Care | Payer: 59 | Source: Ambulatory Visit | Attending: Family Medicine | Admitting: Family Medicine

## 2022-09-05 DIAGNOSIS — Z1231 Encounter for screening mammogram for malignant neoplasm of breast: Secondary | ICD-10-CM

## 2022-09-10 ENCOUNTER — Other Ambulatory Visit: Payer: Self-pay | Admitting: Family Medicine

## 2022-09-10 DIAGNOSIS — R928 Other abnormal and inconclusive findings on diagnostic imaging of breast: Secondary | ICD-10-CM

## 2022-10-02 ENCOUNTER — Ambulatory Visit
Admission: RE | Admit: 2022-10-02 | Discharge: 2022-10-02 | Disposition: A | Discharge status: Home / Self Care | Payer: 59 | Source: Ambulatory Visit | Attending: Family Medicine | Admitting: Family Medicine

## 2022-10-02 ENCOUNTER — Other Ambulatory Visit: Payer: Self-pay | Admitting: Family Medicine

## 2022-10-02 ENCOUNTER — Ambulatory Visit: Payer: 59

## 2022-10-02 DIAGNOSIS — R928 Other abnormal and inconclusive findings on diagnostic imaging of breast: Secondary | ICD-10-CM | POA: Diagnosis not present

## 2022-10-02 DIAGNOSIS — N6310 Unspecified lump in the right breast, unspecified quadrant: Secondary | ICD-10-CM

## 2022-10-03 DIAGNOSIS — E782 Mixed hyperlipidemia: Secondary | ICD-10-CM | POA: Diagnosis not present

## 2022-10-07 ENCOUNTER — Ambulatory Visit
Admission: RE | Admit: 2022-10-07 | Discharge: 2022-10-07 | Disposition: A | Discharge status: Home / Self Care | Payer: 59 | Source: Ambulatory Visit | Attending: Family Medicine | Admitting: Family Medicine

## 2022-10-07 ENCOUNTER — Other Ambulatory Visit: Payer: Self-pay | Admitting: Family Medicine

## 2022-10-07 DIAGNOSIS — N6310 Unspecified lump in the right breast, unspecified quadrant: Secondary | ICD-10-CM

## 2022-10-08 DIAGNOSIS — E782 Mixed hyperlipidemia: Secondary | ICD-10-CM | POA: Diagnosis not present

## 2022-10-08 DIAGNOSIS — J309 Allergic rhinitis, unspecified: Secondary | ICD-10-CM | POA: Diagnosis not present

## 2022-10-08 DIAGNOSIS — R928 Other abnormal and inconclusive findings on diagnostic imaging of breast: Secondary | ICD-10-CM | POA: Diagnosis not present

## 2022-10-08 DIAGNOSIS — I10 Essential (primary) hypertension: Secondary | ICD-10-CM | POA: Diagnosis not present

## 2022-10-08 DIAGNOSIS — M7072 Other bursitis of hip, left hip: Secondary | ICD-10-CM | POA: Diagnosis not present

## 2022-10-17 ENCOUNTER — Ambulatory Visit
Admission: RE | Admit: 2022-10-17 | Discharge: 2022-10-17 | Disposition: A | Discharge status: Home / Self Care | Payer: 59 | Source: Ambulatory Visit | Attending: Family Medicine | Admitting: Family Medicine

## 2022-10-17 DIAGNOSIS — N6310 Unspecified lump in the right breast, unspecified quadrant: Secondary | ICD-10-CM

## 2022-10-17 DIAGNOSIS — D241 Benign neoplasm of right breast: Secondary | ICD-10-CM | POA: Diagnosis not present

## 2022-10-17 DIAGNOSIS — N6312 Unspecified lump in the right breast, upper inner quadrant: Secondary | ICD-10-CM | POA: Diagnosis not present

## 2022-10-17 HISTORY — PX: BREAST BIOPSY: SHX20

## 2022-10-24 ENCOUNTER — Other Ambulatory Visit: Payer: Self-pay | Admitting: Family Medicine

## 2022-10-24 ENCOUNTER — Ambulatory Visit
Admission: RE | Admit: 2022-10-24 | Discharge: 2022-10-24 | Disposition: A | Discharge status: Home / Self Care | Payer: 59 | Source: Ambulatory Visit | Attending: Family Medicine | Admitting: Family Medicine

## 2022-10-24 DIAGNOSIS — N6310 Unspecified lump in the right breast, unspecified quadrant: Secondary | ICD-10-CM

## 2022-10-24 DIAGNOSIS — N6320 Unspecified lump in the left breast, unspecified quadrant: Secondary | ICD-10-CM

## 2022-10-24 DIAGNOSIS — N6312 Unspecified lump in the right breast, upper inner quadrant: Secondary | ICD-10-CM | POA: Diagnosis not present

## 2022-10-27 ENCOUNTER — Ambulatory Visit: Payer: 59

## 2022-10-28 ENCOUNTER — Ambulatory Visit
Admission: RE | Admit: 2022-10-28 | Discharge: 2022-10-28 | Disposition: A | Discharge status: Home / Self Care | Payer: 59 | Source: Ambulatory Visit | Attending: Family Medicine | Admitting: Family Medicine

## 2022-10-28 DIAGNOSIS — N6489 Other specified disorders of breast: Secondary | ICD-10-CM | POA: Diagnosis not present

## 2022-10-28 DIAGNOSIS — N6310 Unspecified lump in the right breast, unspecified quadrant: Secondary | ICD-10-CM

## 2023-02-27 IMAGING — MG MM DIGITAL SCREENING BILAT W/ TOMO AND CAD
8 series · 8 of 24 positions shown · non-contrast
Comparison: Previous exam(s).

CLINICAL DATA: Screening.

EXAM:
DIGITAL SCREENING BILATERAL MAMMOGRAM WITH TOMOSYNTHESIS AND CAD
TECHNIQUE: Bilateral screening digital craniocaudal and mediolateral oblique
mammograms were obtained. Bilateral screening digital breast
tomosynthesis was performed. The images were evaluated with
computer-aided detection.

[L MLO synth-2D]
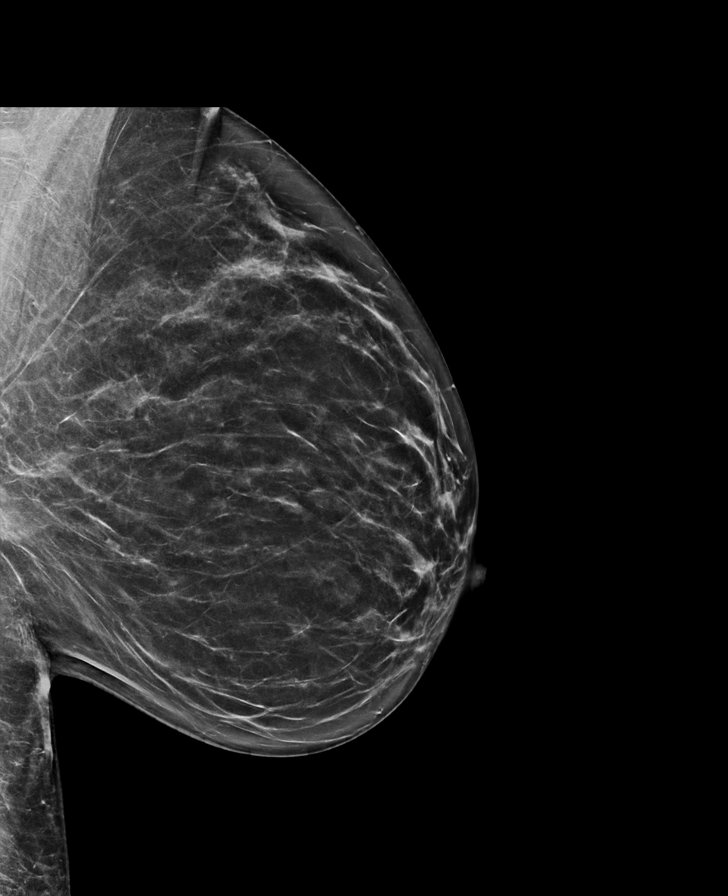

[L CC synth-2D]
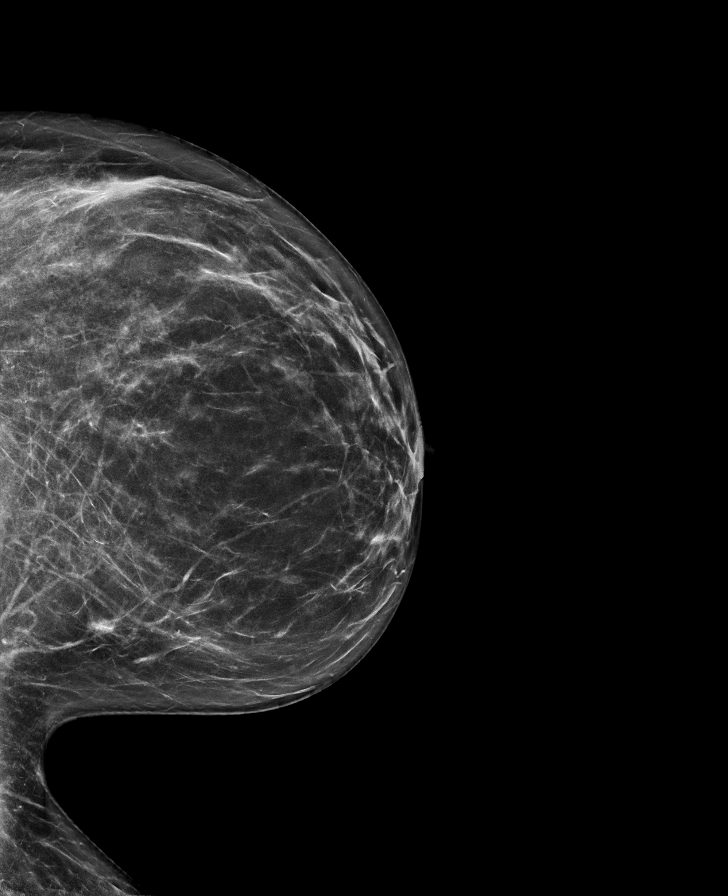

[R MLO synth-2D]
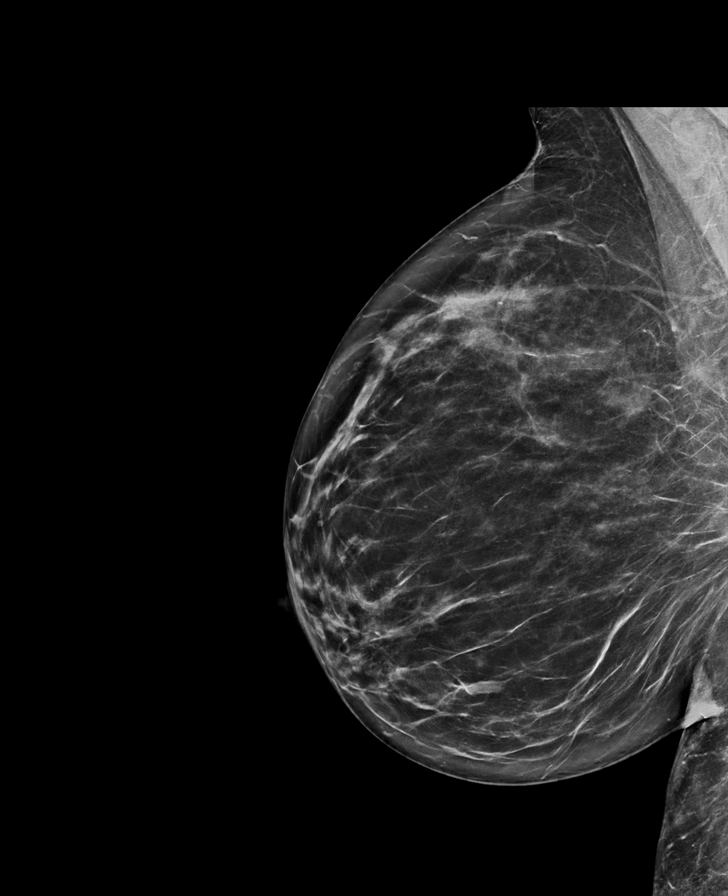

[R CC synth-2D]
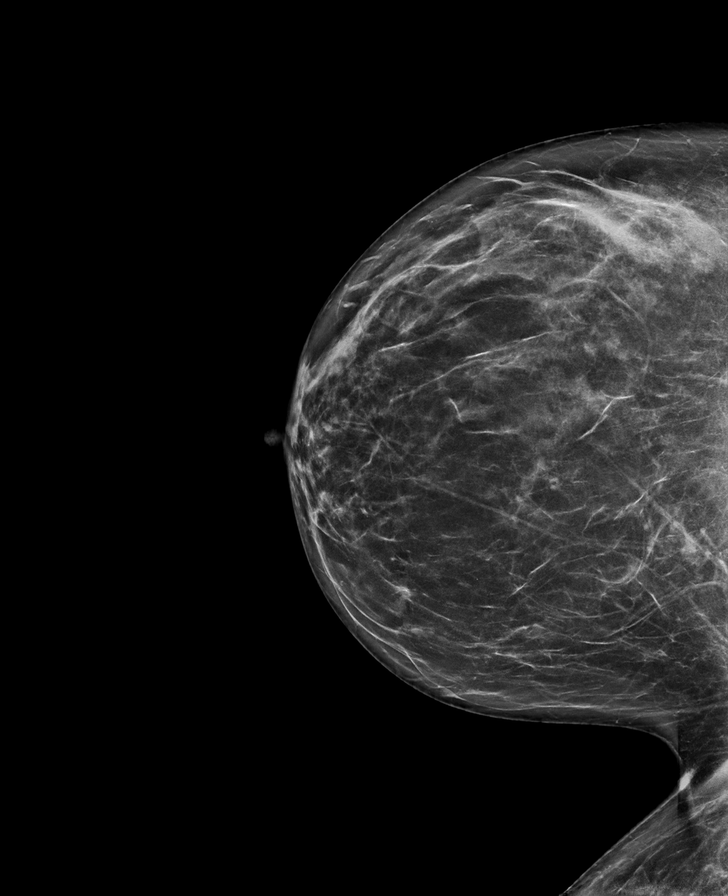

[R CC tomo · tomo slice 37/72.0]
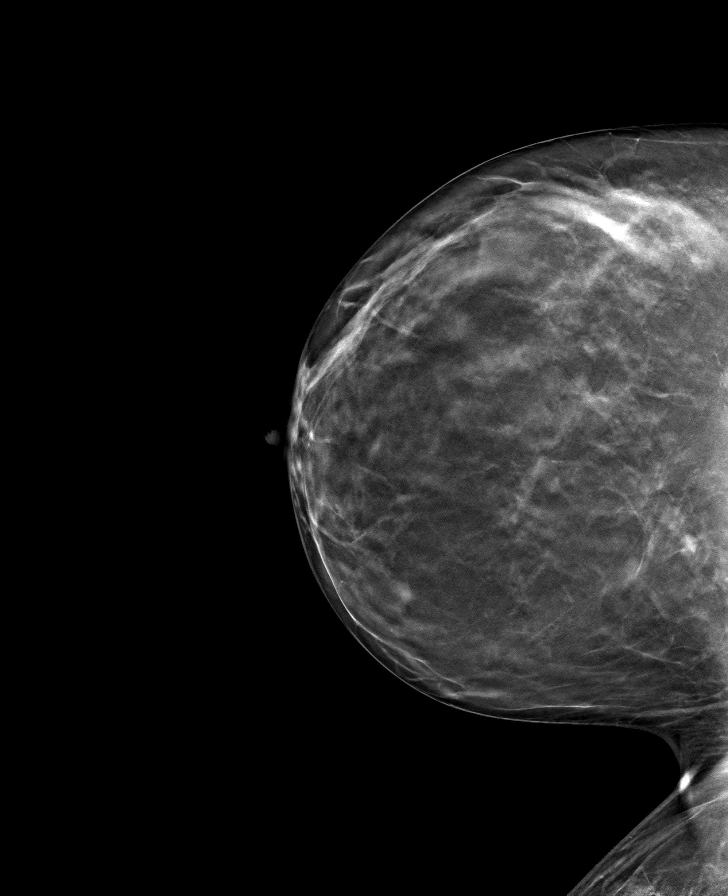

[L MLO tomo · tomo slice 35/70.0]
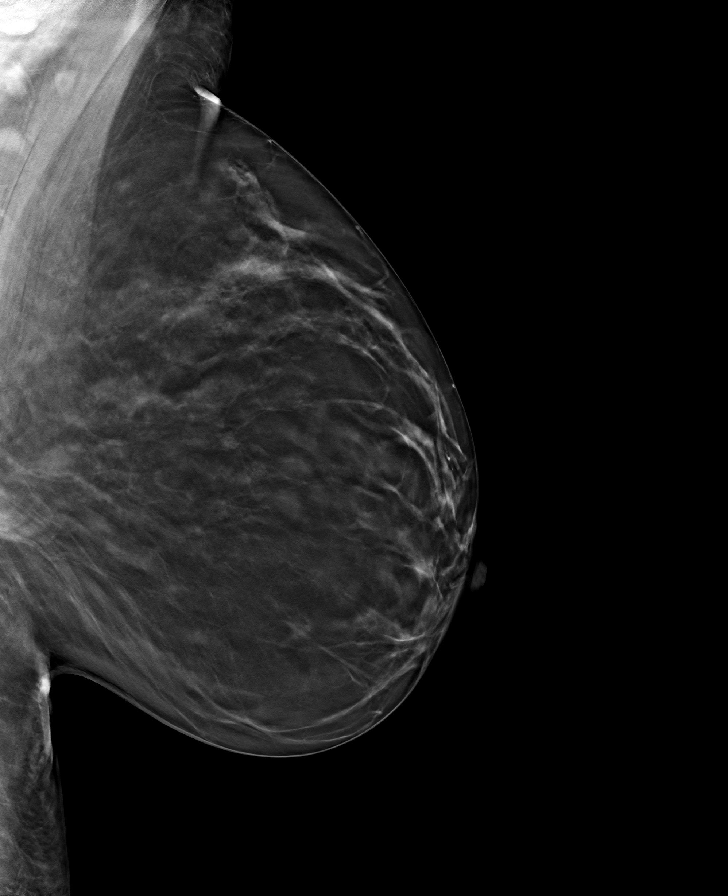

[L CC tomo · tomo slice 35/69.0]
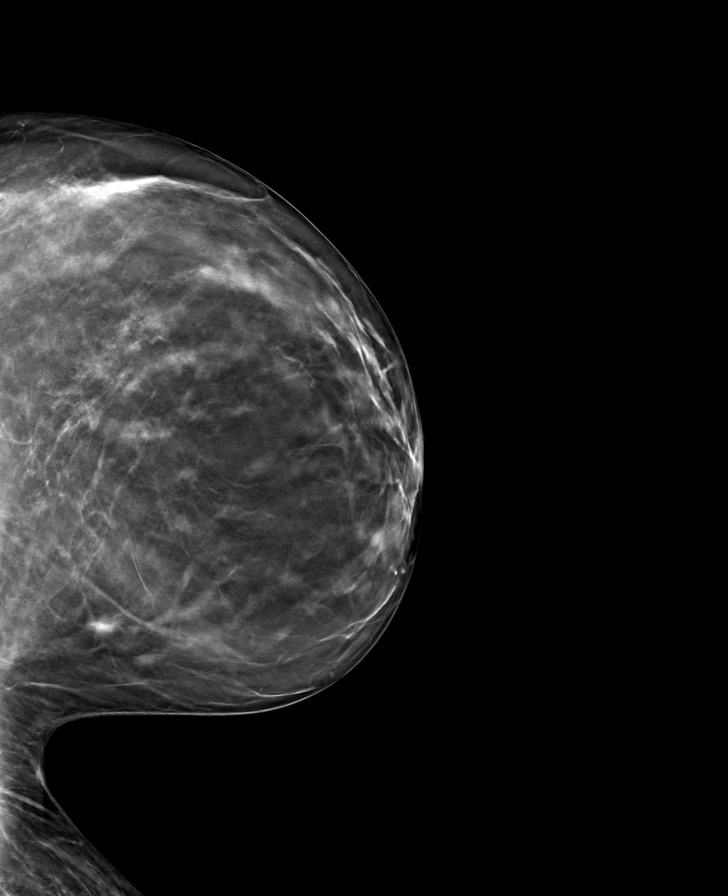

[R MLO tomo · tomo slice 38/75.0]
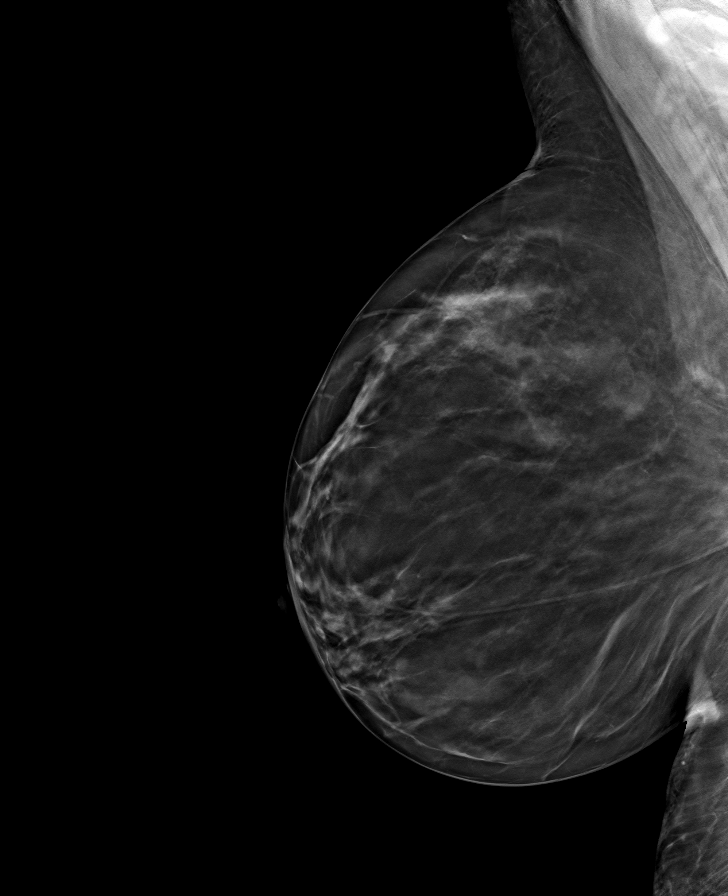

[8 of 24 positions shown; findings below may reference images not displayed]

ACR Breast Density Category b: There are scattered areas of
fibroglandular density.
FINDINGS: There are no findings suspicious for malignancy.
IMPRESSION: No mammographic evidence of malignancy. A result letter of this
screening mammogram will be mailed directly to the patient.

RECOMMENDATION:
Screening mammogram in one year. (Code:51-O-LD2)

BI-RADS CATEGORY  1: Negative.

## 2023-05-04 ENCOUNTER — Ambulatory Visit
Admission: RE | Admit: 2023-05-04 | Discharge: 2023-05-04 | Disposition: A | Discharge status: Home / Self Care | Payer: 59 | Source: Ambulatory Visit | Attending: Nurse Practitioner | Admitting: Nurse Practitioner

## 2023-05-04 ENCOUNTER — Other Ambulatory Visit: Payer: Self-pay | Admitting: Nurse Practitioner

## 2023-05-04 ENCOUNTER — Ambulatory Visit: Payer: 59

## 2023-05-04 DIAGNOSIS — R0789 Other chest pain: Secondary | ICD-10-CM

## 2023-05-04 DIAGNOSIS — R0602 Shortness of breath: Secondary | ICD-10-CM | POA: Diagnosis not present

## 2023-05-04 DIAGNOSIS — R079 Chest pain, unspecified: Secondary | ICD-10-CM | POA: Diagnosis not present

## 2023-05-11 DIAGNOSIS — J309 Allergic rhinitis, unspecified: Secondary | ICD-10-CM | POA: Diagnosis not present

## 2023-05-11 DIAGNOSIS — R0789 Other chest pain: Secondary | ICD-10-CM | POA: Diagnosis not present

## 2023-07-16 DIAGNOSIS — R7989 Other specified abnormal findings of blood chemistry: Secondary | ICD-10-CM | POA: Diagnosis not present

## 2023-07-16 DIAGNOSIS — Z13220 Encounter for screening for lipoid disorders: Secondary | ICD-10-CM | POA: Diagnosis not present

## 2023-07-16 DIAGNOSIS — Z0000 Encounter for general adult medical examination without abnormal findings: Secondary | ICD-10-CM | POA: Diagnosis not present

## 2023-07-16 DIAGNOSIS — I10 Essential (primary) hypertension: Secondary | ICD-10-CM | POA: Diagnosis not present

## 2023-07-16 DIAGNOSIS — E782 Mixed hyperlipidemia: Secondary | ICD-10-CM | POA: Diagnosis not present

## 2023-07-16 DIAGNOSIS — Z114 Encounter for screening for human immunodeficiency virus [HIV]: Secondary | ICD-10-CM | POA: Diagnosis not present

## 2023-07-21 DIAGNOSIS — Z0000 Encounter for general adult medical examination without abnormal findings: Secondary | ICD-10-CM | POA: Diagnosis not present

## 2023-07-21 DIAGNOSIS — Z118 Encounter for screening for other infectious and parasitic diseases: Secondary | ICD-10-CM | POA: Diagnosis not present

## 2023-07-21 DIAGNOSIS — R87616 Satisfactory cervical smear but lacking transformation zone: Secondary | ICD-10-CM | POA: Diagnosis not present

## 2023-07-21 DIAGNOSIS — Z01419 Encounter for gynecological examination (general) (routine) without abnormal findings: Secondary | ICD-10-CM | POA: Diagnosis not present

## 2023-09-18 DIAGNOSIS — R739 Hyperglycemia, unspecified: Secondary | ICD-10-CM | POA: Diagnosis not present

## 2023-09-18 DIAGNOSIS — E559 Vitamin D deficiency, unspecified: Secondary | ICD-10-CM | POA: Diagnosis not present

## 2023-09-18 DIAGNOSIS — R5383 Other fatigue: Secondary | ICD-10-CM | POA: Diagnosis not present

## 2023-09-22 DIAGNOSIS — E559 Vitamin D deficiency, unspecified: Secondary | ICD-10-CM | POA: Diagnosis not present

## 2023-09-22 DIAGNOSIS — G5600 Carpal tunnel syndrome, unspecified upper limb: Secondary | ICD-10-CM | POA: Diagnosis not present

## 2023-09-22 DIAGNOSIS — J309 Allergic rhinitis, unspecified: Secondary | ICD-10-CM | POA: Diagnosis not present

## 2023-09-22 DIAGNOSIS — I10 Essential (primary) hypertension: Secondary | ICD-10-CM | POA: Diagnosis not present

## 2023-10-30 DIAGNOSIS — J069 Acute upper respiratory infection, unspecified: Secondary | ICD-10-CM | POA: Diagnosis not present

## 2023-10-30 DIAGNOSIS — Z1159 Encounter for screening for other viral diseases: Secondary | ICD-10-CM | POA: Diagnosis not present

## 2023-11-17 ENCOUNTER — Ambulatory Visit
Admission: RE | Admit: 2023-11-17 | Discharge: 2023-11-17 | Disposition: A | Discharge status: Home / Self Care | Source: Ambulatory Visit | Attending: Family Medicine | Admitting: Family Medicine

## 2023-11-17 ENCOUNTER — Other Ambulatory Visit: Payer: Self-pay | Admitting: Family Medicine

## 2023-11-17 DIAGNOSIS — Z1231 Encounter for screening mammogram for malignant neoplasm of breast: Secondary | ICD-10-CM | POA: Diagnosis not present

## 2023-12-07 DIAGNOSIS — R5383 Other fatigue: Secondary | ICD-10-CM | POA: Diagnosis not present

## 2023-12-07 DIAGNOSIS — E559 Vitamin D deficiency, unspecified: Secondary | ICD-10-CM | POA: Diagnosis not present

## 2023-12-10 DIAGNOSIS — E663 Overweight: Secondary | ICD-10-CM | POA: Diagnosis not present

## 2023-12-10 DIAGNOSIS — I10 Essential (primary) hypertension: Secondary | ICD-10-CM | POA: Diagnosis not present

## 2023-12-10 DIAGNOSIS — E559 Vitamin D deficiency, unspecified: Secondary | ICD-10-CM | POA: Diagnosis not present

## 2024-01-14 DIAGNOSIS — Z79899 Other long term (current) drug therapy: Secondary | ICD-10-CM | POA: Diagnosis not present

## 2024-01-14 DIAGNOSIS — I10 Essential (primary) hypertension: Secondary | ICD-10-CM | POA: Diagnosis not present

## 2024-01-14 DIAGNOSIS — M722 Plantar fascial fibromatosis: Secondary | ICD-10-CM | POA: Diagnosis not present

## 2024-01-14 DIAGNOSIS — I951 Orthostatic hypotension: Secondary | ICD-10-CM | POA: Diagnosis not present

## 2024-07-21 DIAGNOSIS — Z0000 Encounter for general adult medical examination without abnormal findings: Secondary | ICD-10-CM | POA: Diagnosis not present

## 2024-07-21 DIAGNOSIS — Z23 Encounter for immunization: Secondary | ICD-10-CM | POA: Diagnosis not present

## 2024-07-21 DIAGNOSIS — Z7185 Encounter for immunization safety counseling: Secondary | ICD-10-CM | POA: Diagnosis not present
